# Patient Record
Sex: Female | Born: 1975 | Race: White | Hispanic: No | Marital: Single | Smoking: Former smoker
Health system: Southern US, Community
[De-identification: ages and names within clinical notes are randomized; demographics above are authoritative.]

## PROBLEM LIST (undated history)

## (undated) DIAGNOSIS — E079 Disorder of thyroid, unspecified: Secondary | ICD-10-CM

## (undated) DIAGNOSIS — F419 Anxiety disorder, unspecified: Secondary | ICD-10-CM

## (undated) DIAGNOSIS — T7840XA Allergy, unspecified, initial encounter: Secondary | ICD-10-CM

## (undated) HISTORY — DX: Disorder of thyroid, unspecified: E07.9

## (undated) HISTORY — DX: Anxiety disorder, unspecified: F41.9

## (undated) HISTORY — DX: Allergy, unspecified, initial encounter: T78.40XA

---

## 2006-01-04 HISTORY — PX: SINUSOTOMY: SHX291

## 2009-11-12 ENCOUNTER — Encounter: Admission: RE | Admit: 2009-11-12 | Discharge: 2009-11-12 | Payer: Self-pay | Admitting: Family Medicine

## 2010-01-24 ENCOUNTER — Encounter: Payer: Self-pay | Admitting: Family Medicine

## 2010-02-19 LAB — LIPID PANEL
HDL: 70 mg/dL (ref 35–70)
LDL Cholesterol: 81 mg/dL

## 2010-02-19 LAB — TSH: TSH: 2.73 u[IU]/mL (ref <=5.90)

## 2010-03-27 ENCOUNTER — Encounter: Payer: Self-pay | Admitting: Genetic Counselor

## 2010-08-30 ENCOUNTER — Emergency Department (HOSPITAL_COMMUNITY): Payer: BC Managed Care – PPO

## 2010-08-30 ENCOUNTER — Emergency Department (HOSPITAL_COMMUNITY)
Admission: EM | Admit: 2010-08-30 | Discharge: 2010-08-30 | Disposition: A | Discharge status: Home / Self Care | Payer: BC Managed Care – PPO | Attending: Emergency Medicine | Admitting: Emergency Medicine

## 2010-08-30 DIAGNOSIS — S7000XA Contusion of unspecified hip, initial encounter: Secondary | ICD-10-CM | POA: Insufficient documentation

## 2010-08-30 DIAGNOSIS — E039 Hypothyroidism, unspecified: Secondary | ICD-10-CM | POA: Insufficient documentation

## 2010-08-30 DIAGNOSIS — S0990XA Unspecified injury of head, initial encounter: Secondary | ICD-10-CM | POA: Insufficient documentation

## 2011-01-04 ENCOUNTER — Ambulatory Visit (INDEPENDENT_AMBULATORY_CARE_PROVIDER_SITE_OTHER): Payer: BC Managed Care – PPO

## 2011-01-04 DIAGNOSIS — J019 Acute sinusitis, unspecified: Secondary | ICD-10-CM

## 2011-01-04 DIAGNOSIS — J029 Acute pharyngitis, unspecified: Secondary | ICD-10-CM

## 2011-01-07 ENCOUNTER — Telehealth: Payer: Self-pay | Admitting: *Deleted

## 2011-01-07 NOTE — Telephone Encounter (Signed)
left voice asking the patient to please give me a call about setting up appointment for high risk clinic

## 2011-01-12 ENCOUNTER — Ambulatory Visit (INDEPENDENT_AMBULATORY_CARE_PROVIDER_SITE_OTHER): Payer: BC Managed Care – PPO

## 2011-01-12 DIAGNOSIS — J069 Acute upper respiratory infection, unspecified: Secondary | ICD-10-CM

## 2011-01-12 DIAGNOSIS — R5381 Other malaise: Secondary | ICD-10-CM

## 2011-02-24 ENCOUNTER — Encounter: Payer: Self-pay | Admitting: Family Medicine

## 2011-02-24 ENCOUNTER — Encounter: Payer: Self-pay | Admitting: *Deleted

## 2011-02-24 NOTE — Progress Notes (Signed)
Mailed letter to pt about High Risk clinic. 

## 2011-02-25 ENCOUNTER — Encounter: Payer: Self-pay | Admitting: Family Medicine

## 2011-02-25 ENCOUNTER — Ambulatory Visit (INDEPENDENT_AMBULATORY_CARE_PROVIDER_SITE_OTHER): Payer: BC Managed Care – PPO | Admitting: Family Medicine

## 2011-02-25 VITALS — BP 111/78 | HR 91 | Temp 98.5°F | Resp 16 | Ht 69.0 in | Wt 155.2 lb

## 2011-02-25 DIAGNOSIS — Z0000 Encounter for general adult medical examination without abnormal findings: Secondary | ICD-10-CM

## 2011-02-25 DIAGNOSIS — E039 Hypothyroidism, unspecified: Secondary | ICD-10-CM

## 2011-02-25 DIAGNOSIS — Z23 Encounter for immunization: Secondary | ICD-10-CM

## 2011-02-25 MED ORDER — LEVOCETIRIZINE DIHYDROCHLORIDE 5 MG PO TABS: 5.0000 mg | ORAL_TABLET | Freq: Every day | ORAL | Status: DC

## 2011-02-25 MED ORDER — FLUTICASONE FUROATE 27.5 MCG/SPRAY NA SUSP: 2.0000 | Freq: Every day | NASAL | Status: DC

## 2011-02-25 MED ORDER — RIZATRIPTAN BENZOATE 10 MG PO TABS: 10.0000 mg | ORAL_TABLET | ORAL | Status: DC | PRN

## 2011-02-25 NOTE — Progress Notes (Signed)
Patient ID: Molly Lozano MRN: [id number], DOB: 1975/02/14, 36 y.o. Date of Encounter: 02/25/2011, 2:44 PM  Primary Physician: No primary provider on file.  Chief Complaint: Physical (CPE)  HPI: 36 y.o. y/o female with history of noted below here for CPE.  Doing well. Low back pain last fall with disc issues   Review of Systems: Consitutional: No fever, chills, fatigue, night sweats, lymphadenopathy, or weight changes. Eyes: No visual changes, eye redness, or discharge. ENT/Mouth: Ears: No otalgia, tinnitus, hearing loss, discharge. Nose: No congestion, rhinorrhea, sinus pain, or epistaxis. Throat: No sore throat, post nasal drip, or teeth pain. Cardiovascular: No CP, palpitations, diaphoresis, DOE, edema, orthopnea, PND. Respiratory: No cough, hemoptysis, SOB, or wheezing. Gastrointestinal: No anorexia, dysphagia, reflux, pain, nausea, vomiting, hematemesis, diarrhea, constipation, BRBPR, or melena.  Genitourinary: No dysuria, frequency, urgency, hematuria, incontinence, nocturia, amenorrhea, vaginal discharge, pruritis, burning, abnormal bleeding, or pain. Musculoskeletal: No decreased ROM, myalgias, stiffness, joint swelling, or weakness. Skin: No rash, erythema, lesion changes, pain, warmth, jaundice, or pruritis. Neurological: No headache, dizziness, syncope, seizures, tremors, memory loss, coordination problems, or paresthesias. Psychological: No anxiety, depression, hallucinations, SI/HI. Endocrine: No fatigue, polydipsia, polyphagia, polyuria, or known diabetes. All other systems were reviewed and are otherwise negative.   Home Meds:  Prior to Admission medications   Medication Sig Start Date End Date Taking? Authorizing Provider  estradiol (ESTRACE) 0.1 MG/GM vaginal cream Place 1 g vaginally daily.   Yes Historical Provider, MD  fluticasone (VERAMYST) 27.5 MCG/SPRAY nasal spray Place 2 sprays into the nose daily. 02/25/11  Yes Elvina Sidle, MD  levocetirizine (XYZAL) 5  MG tablet Take 1 tablet (5 mg total) by mouth daily. 02/25/11  Yes Elvina Sidle, MD  levonorgestrel-ethinyl estradiol (LYBREL,AMETHYST) 90-20 MCG tablet Take 1 tablet by mouth daily.   Yes Historical Provider, MD  levothyroxine (SYNTHROID, LEVOTHROID) 25 MCG tablet Take 25 mcg by mouth daily.   Yes Historical Provider, MD  Multiple Vitamin (MULTIVITAMIN) tablet Take 1 tablet by mouth daily.   Yes Historical Provider, MD  rizatriptan (MAXALT) 10 MG tablet Take 1 tablet (10 mg total) by mouth as needed. May repeat in 2 hours if needed 02/25/11  Yes Elvina Sidle, MD    Allergies:  Allergies  Allergen Reactions  . Amoxicillin Nausea And Vomiting  . Mucinex Other (See Comments)    "off balance"    History   Social History  . Marital Status: Single    Spouse Name: N/A    Number of Children: N/A  . Years of Education: N/A   Occupational History  . Not on file.   Social History Main Topics  . Smoking status: Former Smoker    Types: Cigarettes    Quit date: 01/04/2002  . Smokeless tobacco: Not on file  . Alcohol Use:   . Drug Use:   . Sexually Active:    Other Topics Concern  . Not on file   Social History Narrative  . No narrative on file    No family history on file.  Physical Exam: Blood pressure 111/78, pulse 91, temperature 98.5 F (36.9 C), temperature source Oral, resp. rate 16, height 5\' 9"  (1.753 m), weight 155 lb 3.2 oz (70.398 kg)., Body mass index is 22.92 kg/(m^2). General: Well developed, well nourished, in no acute distress. HEENT: Normocephalic, atraumatic. Conjunctiva pink, sclera non-icteric. Pupils 2 mm constricting to 1 mm, round, regular, and equally reactive to light and accomodation. EOMI. Internal auditory canal clear. TMs with good cone of light and without pathology. Nasal  mucosa pink. Nares are without discharge. No sinus tenderness. Oral mucosa pink. Dentition. Pharynx without exudate.   Neck: Supple. Trachea midline. No thyromegaly. Full ROM.  No lymphadenopathy. Lungs: Clear to auscultation bilaterally without wheezes, rales, or rhonchi. Breathing is of normal effort and unlabored. Cardiovascular: RRR with S1 S2. No murmurs, rubs, or gallops appreciated. Distal pulses 2+ symmetrically. No carotid or abdominal bruits.  Abdomen: Soft, non-tender, non-distended with normoactive bowel sounds. No hepatosplenomegaly or masses. No rebound/guarding. No CVA tenderness. Without hernias.   Musculoskeletal: Full range of motion and 5/5 strength throughout. Without swelling, atrophy, tenderness, crepitus, or warmth. Extremities without clubbing, cyanosis, or edema. Calves supple. Skin: Warm and moist without erythema, ecchymosis, wounds, or rash. Neuro: A+Ox3. CN II-XII grossly intact. Moves all extremities spontaneously. Full sensation throughout. Normal gait. DTR 2+ throughout upper and lower extremities. Finger to nose intact. Psych:  Responds to questions appropriately with a normal affect.     Assessment/Plan:  Low back pain, mild, slow improvement since last fall.  Recommend swimming, yoga. Also suggest relaxation from work  36 y.o. y/o female here for CPE   Signed, Oryan Winterton 02/25/2011 2:44 PM

## 2011-02-26 LAB — COMPREHENSIVE METABOLIC PANEL
ALT: 13 U/L (ref 0–35)
AST: 19 U/L (ref 0–37)
Albumin: 4.7 g/dL (ref 3.5–5.2)
Alkaline Phosphatase: 43 U/L (ref 39–117)
BUN: 14 mg/dL (ref 6–23)
CO2: 23 mEq/L (ref 19–32)
Calcium: 9.9 mg/dL (ref 8.4–10.5)
Chloride: 106 mEq/L (ref 96–112)
Creat: 0.77 mg/dL (ref 0.50–1.10)
Glucose, Bld: 86 mg/dL (ref 70–99)
Potassium: 4.7 mEq/L (ref 3.5–5.3)
Sodium: 138 mEq/L (ref 135–145)
Total Bilirubin: 0.9 mg/dL (ref 0.3–1.2)
Total Protein: 7.1 g/dL (ref 6.0–8.3)

## 2011-02-26 LAB — TSH: TSH: 2.516 u[IU]/mL (ref 0.350–4.500)

## 2011-04-30 ENCOUNTER — Other Ambulatory Visit: Payer: Self-pay | Admitting: Family Medicine

## 2011-06-03 ENCOUNTER — Encounter: Payer: Self-pay | Admitting: Family Medicine

## 2011-06-03 ENCOUNTER — Ambulatory Visit (INDEPENDENT_AMBULATORY_CARE_PROVIDER_SITE_OTHER): Payer: BC Managed Care – PPO | Admitting: Family Medicine

## 2011-06-03 VITALS — BP 110/78 | HR 89 | Temp 98.9°F | Resp 16 | Ht 69.0 in | Wt 150.0 lb

## 2011-06-03 DIAGNOSIS — R059 Cough, unspecified: Secondary | ICD-10-CM

## 2011-06-03 DIAGNOSIS — J029 Acute pharyngitis, unspecified: Secondary | ICD-10-CM

## 2011-06-03 DIAGNOSIS — J40 Bronchitis, not specified as acute or chronic: Secondary | ICD-10-CM

## 2011-06-03 DIAGNOSIS — R05 Cough: Secondary | ICD-10-CM

## 2011-06-03 LAB — POCT CBC
Granulocyte percent: 83.7 %G — AB (ref 37–80)
MCH, POC: 28.9 pg (ref 27–31.2)
MID (cbc): 0.6 (ref 0–0.9)
MPV: 10.6 fL (ref 0–99.8)
POC MID %: 4.1 %M (ref 0–12)
Platelet Count, POC: 314 10*3/uL (ref 142–424)
RBC: 5.26 M/uL (ref 4.04–5.48)
WBC: 14.9 10*3/uL — AB (ref 4.6–10.2)

## 2011-06-03 MED ORDER — BENZONATATE 100 MG PO CAPS: ORAL_CAPSULE | ORAL | Status: DC

## 2011-06-03 MED ORDER — AZITHROMYCIN 250 MG PO TABS: ORAL_TABLET | ORAL | Status: DC

## 2011-06-03 NOTE — Patient Instructions (Signed)
Use Mucinex DM as necessary for cough Drink lots of fluids Tylenol or ibuprofen for sore throat and fever Tessalon as necessary for additional cough medication Zithromax for infection Return if worse

## 2011-06-03 NOTE — Progress Notes (Signed)
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 She came back Monday. On Tuesday she developed a sore throat and respiratory congestion. 2 teething she had a temperature of 102. She felt sick and yesterday stayed off of work. She did not have a fever we checked it yesterday evening. She has continued with a sore throat. Not blowing much out of her nose but she coughs and brings up a lot of yellow mucus. She does not get many infections like this, says this is worse she's been about 3 years.  Objective: Alert white female mildly ill-appearing. TMs are normal. Throat erythematous without exudate. She does have some mucus in her throat. Strep screen is taken. Neck supple without nodes. Chest is clear to auscultation. Heart regular.  Assessment: Sore throat Cough  Plan: CBC and strep screen and proceed from there Results for orders placed in visit on 06/03/11  POCT RAPID STREP A (OFFICE)      Component Value Range   Rapid Strep A Screen Negative  Negative   POCT CBC      Component Value Range   WBC 14.9 (*) 4.6 - 10.2 (K/uL)   Lymph, poc 1.8  0.6 - 3.4    POC LYMPH PERCENT 12.2  10 - 50 (%L)   MID (cbc) 0.6  0 - 0.9    POC MID % 4.1  0 - 12 (%M)   POC Granulocyte 12.5 (*) 2 - 6.9    Granulocyte percent 83.7 (*) 37 - 80 (%G)   RBC 5.26  4.04 - 5.48 (M/uL)   Hemoglobin 15.2  12.2 - 16.2 (g/dL)   HCT, POC 82.9  56.2 - 47.9 (%)   MCV 88.4  80 - 97 (fL)   MCH, POC 28.9  27 - 31.2 (pg)   MCHC 32.7  31.8 - 35.4 (g/dL)   RDW, POC 13.0     Platelet Count, POC 314  142 - 424 (K/uL)   MPV 10.6  0 - 99.8 (fL)   Strep test is negative, but with the high white blood count I am going to go ahead and treat her with antibiotics for bronchitis. [place] Assessment: Bronchitis Non- strep pharyngitis

## 2011-06-11 ENCOUNTER — Ambulatory Visit (INDEPENDENT_AMBULATORY_CARE_PROVIDER_SITE_OTHER): Payer: BC Managed Care – PPO | Admitting: Family Medicine

## 2011-06-11 VITALS — BP 97/65 | HR 75 | Temp 98.5°F | Resp 16 | Ht 69.18 in | Wt 149.0 lb

## 2011-06-11 DIAGNOSIS — R05 Cough: Secondary | ICD-10-CM

## 2011-06-11 DIAGNOSIS — H6500 Acute serous otitis media, unspecified ear: Secondary | ICD-10-CM

## 2011-06-11 DIAGNOSIS — H6590 Unspecified nonsuppurative otitis media, unspecified ear: Secondary | ICD-10-CM

## 2011-06-11 DIAGNOSIS — J019 Acute sinusitis, unspecified: Secondary | ICD-10-CM

## 2011-06-11 DIAGNOSIS — J329 Chronic sinusitis, unspecified: Secondary | ICD-10-CM

## 2011-06-11 DIAGNOSIS — R059 Cough, unspecified: Secondary | ICD-10-CM

## 2011-06-11 MED ORDER — HYDROCODONE-ACETAMINOPHEN 5-500 MG PO TABS: ORAL_TABLET | ORAL | Status: DC

## 2011-06-11 MED ORDER — PREDNISONE 20 MG PO TABS: ORAL_TABLET | ORAL | Status: DC

## 2011-06-11 MED ORDER — LEVOFLOXACIN 500 MG PO TABS: 500.0000 mg | ORAL_TABLET | Freq: Every day | ORAL | Status: AC

## 2011-06-11 NOTE — Progress Notes (Signed)
S: Ear plugged sensation.  Fullness. Cough at night persists Some hot flashes, no defined fever  O: TM L ear fluid level Throat clear Sinuses tender Cheest clear Heart rrr  A:  Serous otitis Sinusitis Cough  See rx

## 2011-06-11 NOTE — Patient Instructions (Signed)
Use hydrocodone pills for cough.  Levaquin for ear infection.  Prednisone to try to open up the eustachian tube.  Return if not improving.

## 2011-12-16 ENCOUNTER — Telehealth: Payer: Self-pay

## 2011-12-16 MED ORDER — LEVOTHYROXINE SODIUM 25 MCG PO TABS: 25.0000 ug | ORAL_TABLET | Freq: Every day | ORAL | Status: DC

## 2011-12-16 NOTE — Telephone Encounter (Signed)
The patient stated that she took her last Levothyroxine 25 mg pill two days ago and her mail order pharmacy won't deliver her medicine for another 6 days.  The mail order pharmacy advised the patient to call [facility] and ask for a 7 day only rx to take to a local pharmacy.  The patient stated that she would prefer the medication [facility] [address].  Please call the patient at [phone].

## 2011-12-16 NOTE — Telephone Encounter (Signed)
Notified pt on VM that [facility]

## 2011-12-16 NOTE — Telephone Encounter (Signed)
[facility] to [facility] [address]

## 2011-12-28 ENCOUNTER — Other Ambulatory Visit: Payer: Self-pay

## 2011-12-28 MED ORDER — LEVOTHYROXINE SODIUM 25 MCG PO TABS: 25.0000 ug | ORAL_TABLET | Freq: Every day | ORAL | Status: DC

## 2011-12-28 NOTE — Telephone Encounter (Signed)
Meds ordered this encounter  Medications  . levothyroxine (SYNTHROID, LEVOTHROID) 25 MCG tablet    Sig: Take 1 tablet (25 mcg total) by mouth daily.    Dispense:  7 tablet    Refill:  0    Order Specific Question:  Supervising Provider    Answer:  DOOLITTLE, ROBERT P [[id number]]  . levothyroxine (SYNTHROID, LEVOTHROID) 25 MCG tablet    Sig: Take 1 tablet (25 mcg total) by mouth daily. Need office visit & labs for additional refills.    Dispense:  90 tablet    Refill:  0    Order Specific Question:  Supervising Provider    Answer:  DOOLITTLE, ROBERT P [[id number]]

## 2011-12-28 NOTE — Telephone Encounter (Signed)
Patient states that she needs to get 7 days supply of synthroid called in to [facility]

## 2011-12-28 NOTE — Telephone Encounter (Signed)
[facility] is calling wanting to know if we could call in or fax in a perscription for this patient's Synthroid 90 day supply with 3 refills.  They mailed out the packet and it was returned to them because of the wrong address.  They would like to have the medicine out to the patient by Friday.   Ph#: [phone]  Fx#:  [phone]

## 2012-03-06 ENCOUNTER — Ambulatory Visit (INDEPENDENT_AMBULATORY_CARE_PROVIDER_SITE_OTHER): Payer: BC Managed Care – PPO | Admitting: Family Medicine

## 2012-03-06 ENCOUNTER — Other Ambulatory Visit: Payer: Self-pay

## 2012-03-06 VITALS — BP 112/76 | HR 84 | Temp 97.7°F | Resp 16 | Ht 69.5 in | Wt 151.0 lb

## 2012-03-06 DIAGNOSIS — M5116 Intervertebral disc disorders with radiculopathy, lumbar region: Secondary | ICD-10-CM

## 2012-03-06 DIAGNOSIS — M549 Dorsalgia, unspecified: Secondary | ICD-10-CM

## 2012-03-06 DIAGNOSIS — J029 Acute pharyngitis, unspecified: Secondary | ICD-10-CM

## 2012-03-06 DIAGNOSIS — M5126 Other intervertebral disc displacement, lumbar region: Secondary | ICD-10-CM

## 2012-03-06 MED ORDER — PREDNISONE 20 MG PO TABS: ORAL_TABLET | ORAL | Status: DC

## 2012-03-06 MED ORDER — HYDROCODONE-ACETAMINOPHEN 5-325 MG PO TABS: ORAL_TABLET | ORAL | Status: DC

## 2012-03-06 NOTE — Patient Instructions (Signed)
Viral Pharyngitis  Viral pharyngitis is a viral infection that produces redness, pain, and swelling (inflammation) of the throat. It can spread from person to person (contagious).  CAUSES  Viral pharyngitis is caused by inhaling a large amount of certain germs called viruses. Many different viruses cause viral pharyngitis.  SYMPTOMS  Symptoms of viral pharyngitis include:   Sore throat.   Tiredness.   Stuffy nose.   Low-grade fever.   Congestion.   Cough.  TREATMENT  Treatment includes rest, drinking plenty of fluids, and the use of over-the-counter medication (approved by your caregiver).  HOME CARE INSTRUCTIONS    Drink enough fluids to keep your urine clear or pale yellow.   Eat soft, cold foods such as ice cream, frozen ice pops, or gelatin dessert.   Gargle with warm salt water (1 tsp salt per 1 qt of water).   If over age 7, throat lozenges may be used safely.   Only take over-the-counter or prescription medicines for pain, discomfort, or fever as directed by your caregiver. Do not take aspirin.  To help prevent spreading viral pharyngitis to others, avoid:   Mouth-to-mouth contact with others.   Sharing utensils for eating and drinking.   Coughing around others.  SEEK MEDICAL CARE IF:    You are better in a few days, then become worse.   You have a fever or pain not helped by pain medicines.   There are any other changes that concern you.  Document Released: 09/30/2004 Document Revised: 03/15/2011 Document Reviewed: 02/26/2010  Exit[employer] Patient Information 2013 [employer].

## 2012-03-06 NOTE — Addendum Note (Signed)
Addended by: Eddie Candle on: 03/06/2012 02:36 PM   Modules accepted: Orders, Medications

## 2012-03-06 NOTE — Progress Notes (Signed)
Subjective: 37 year old lady with history of having a sore throat since over the weekend. She has not had a lot of nasal drainage, but is aware of postnasal drainage. She does not smoke. She has not been coughing a lot yet. The throat is been fairly painful.  She also has been having right lumbar radiculopathy for about a week. She has a history of a disc problem 2 years ago. Has been going to a chiropractor for adjustments regularly, and has been to her back surgeon, Dr.Tooke , who had to treat her with prednisone in January for what the MRI revealed to be a new area of herniation of disc as compared to 2 years ago. She did better until last week when she tripped a tiny bit, flaring it up again.  Objective: Pleasant alert oriented young lady in no major distress. Her gait is fair, though seems to walk carefully from the back. Her TMs are normal. Throat mild erythema without any exit 8. Strep screen was taken. Neck is supple without significant nodes. Chest clear to auscultation. Heart regular.  Her straight leg raising test causes some contralateral pain when raising the left leg up to above 80. The right leg also raises to above 80. Deep tendon reflexes knee jerks are symmetrical. She is not able to flex forward in a standing position, though she can extend a fair amount. Side to side tilt and upper body rotation is good.   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

## 2012-03-15 ENCOUNTER — Ambulatory Visit (INDEPENDENT_AMBULATORY_CARE_PROVIDER_SITE_OTHER): Payer: BC Managed Care – PPO | Admitting: Family Medicine

## 2012-03-15 ENCOUNTER — Encounter: Payer: Self-pay | Admitting: Family Medicine

## 2012-03-15 VITALS — BP 106/68 | HR 85 | Temp 99.0°F | Resp 12 | Ht 69.0 in | Wt 151.2 lb

## 2012-03-15 DIAGNOSIS — J309 Allergic rhinitis, unspecified: Secondary | ICD-10-CM | POA: Insufficient documentation

## 2012-03-15 DIAGNOSIS — E039 Hypothyroidism, unspecified: Secondary | ICD-10-CM

## 2012-03-15 DIAGNOSIS — J329 Chronic sinusitis, unspecified: Secondary | ICD-10-CM

## 2012-03-15 DIAGNOSIS — M549 Dorsalgia, unspecified: Secondary | ICD-10-CM

## 2012-03-15 MED ORDER — PREDNISONE 10 MG PO TABS: 10.0000 mg | ORAL_TABLET | ORAL | Status: DC

## 2012-03-15 MED ORDER — CYCLOBENZAPRINE HCL 5 MG PO TABS: 5.0000 mg | ORAL_TABLET | Freq: Every evening | ORAL | Status: DC | PRN

## 2012-03-15 MED ORDER — LEVOTHYROXINE SODIUM 25 MCG PO TABS: 25.0000 ug | ORAL_TABLET | Freq: Every day | ORAL | Status: DC

## 2012-03-15 MED ORDER — LEVOCETIRIZINE DIHYDROCHLORIDE 5 MG PO TABS: 5.0000 mg | ORAL_TABLET | Freq: Every day | ORAL | Status: DC

## 2012-03-15 MED ORDER — FLUTICASONE FUROATE 27.5 MCG/SPRAY NA SUSP: 2.0000 | Freq: Every day | NASAL | Status: DC

## 2012-03-15 MED ORDER — LEVOFLOXACIN 500 MG PO TABS: 500.0000 mg | ORAL_TABLET | Freq: Every day | ORAL | Status: DC

## 2012-03-15 NOTE — Progress Notes (Signed)
Is a 37 year old woman with chronic intermittent back pain which became worse after her recent chiropractor visit where he was manipulation resulted in increased low back pain. She's done better with the prednisone urgently prescribed and is now back at work. Nevertheless he continues to have soreness in her low back and the MRI did show swelling of the disc in that area. She's tried using cold compresses which have only given her minimal relief. She is no leg pain radiation. Has no bowel or bladder incontinence. Her sleep has been interrupted because of pain and would like some thing done for this.  Patient also needs her yearly refills on her allergy medicine which is done well controlling her allergy symptoms. She's had a recent URI and has persistent sinus congestion over the past 2 weeks. Her sore throat is resolved and she does not have a cough. She also does not have a fever. She has minimal facial pain but does have some pressure in her cheeks.  Patient also needs to have her thyroid checked. He was last checked one year ago and she's been largely asymptomatic on her current Synthroid dose.  Objective: No acute distress TMs normal Oropharynx: No erythema and, slight swelling of the tonsils Nose: Mucopurulent discharge with swelling Neck: Supple no adenopathy Chest: Clear Skin: Normal Straight leg raising: Negative while sitting  Assessment: URI with subsequent sinus infection, persistent low back pain which is improving, chronic allergic rhinitis, mild hypothyroidism  Plan: Allergic rhinitis - Plan: fluticasone (VERAMYST) 27.5 MCG/SPRAY nasal spray, levocetirizine (XYZAL) 5 MG tablet  Unspecified hypothyroidism - Plan: levothyroxine (SYNTHROID, LEVOTHROID) 25 MCG tablet, TSH  Unspecified sinusitis (chronic) - Plan: levofloxacin (LEVAQUIN) 500 MG tablet  Back pain - Plan: predniSONE (DELTASONE) 10 MG tablet, cyclobenzaprine (FLEXERIL) 5 MG tablet  Signed, Sheila Oats.D.

## 2012-03-16 LAB — TSH: TSH: 1.234 u[IU]/mL (ref 0.350–4.500)

## 2012-03-17 ENCOUNTER — Telehealth: Payer: Self-pay | Admitting: Radiology

## 2012-03-17 NOTE — Telephone Encounter (Signed)
Refill encounter only 

## 2012-03-21 ENCOUNTER — Telehealth: Payer: Self-pay | Admitting: Physician Assistant

## 2012-03-21 DIAGNOSIS — G43909 Migraine, unspecified, not intractable, without status migrainosus: Secondary | ICD-10-CM

## 2012-03-21 MED ORDER — RIZATRIPTAN BENZOATE 10 MG PO TABS: 10.0000 mg | ORAL_TABLET | ORAL | Status: DC | PRN

## 2012-03-21 NOTE — Telephone Encounter (Signed)
Received faxed rx request for Maxalt.  Sent to pharmacy

## 2012-03-23 ENCOUNTER — Telehealth: Payer: Self-pay

## 2012-03-23 NOTE — Telephone Encounter (Signed)
[facility] called and stated that Veramyst NS Dr L Rxd is not on their preferred list and asked if Dr L could change Rx to one of the preferred NS which are: fluticasone, triamcinolone NS, Nasonex, Q Nasal. We should put reference # on new Rx which is #[id number]. Dr L do you want to send in a different Rx?

## 2012-03-24 NOTE — Telephone Encounter (Signed)
[facility] called back to check status of request for a change in Rx to a preferred NS listed in message below. Dr L, please advise.

## 2012-03-25 MED ORDER — MOMETASONE FUROATE 50 MCG/ACT NA SUSP: 2.0000 | Freq: Every day | NASAL | Status: DC

## 2012-03-25 NOTE — Telephone Encounter (Signed)
Rx sent in

## 2012-03-25 NOTE — Telephone Encounter (Signed)
Please call in Nasonex QD with 1 year refill, thanks

## 2012-05-30 ENCOUNTER — Ambulatory Visit (INDEPENDENT_AMBULATORY_CARE_PROVIDER_SITE_OTHER): Payer: BC Managed Care – PPO | Admitting: Family Medicine

## 2012-05-30 VITALS — BP 110/84 | HR 76 | Temp 98.0°F | Resp 16 | Ht 70.0 in | Wt 162.0 lb

## 2012-05-30 DIAGNOSIS — M545 Low back pain, unspecified: Secondary | ICD-10-CM

## 2012-05-30 DIAGNOSIS — M5416 Radiculopathy, lumbar region: Secondary | ICD-10-CM

## 2012-05-30 DIAGNOSIS — IMO0002 Reserved for concepts with insufficient information to code with codable children: Secondary | ICD-10-CM

## 2012-05-30 MED ORDER — METHOCARBAMOL 750 MG PO TABS: ORAL_TABLET | ORAL | Status: DC

## 2012-05-30 MED ORDER — PREDNISONE 20 MG PO TABS: ORAL_TABLET | ORAL | Status: DC

## 2012-05-30 NOTE — Progress Notes (Signed)
Subjective: 37 year old lady who has been having a long history of low back pain problems. She is seeing numerous physicians and had various studies done. She has been found to have a little bulge of a couple of discs and possible nerve impingement. She has received 2 cortisone injections to her back, the last about 6 weeks ago. She has tried various modalities, physical therapy, cupping, etc. without good long relief. She says that her weight is at a high level for her right now. 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 The pain centers in the sacral area primarily, feels like bubbles sitting back there. Pain radiates down to her legs some, more to the right. Bowels and been acting fair, though it hurts to push so she doesn't go as well currently. No new injuries. She does not play any sports.  Objective: Healthy-appearing young lady in no major distress distress moving around she moves slowly. She says her biggest concern is that lysis of she can hardly walk now. Her abdomen is soft. Paraspinous muscles are adequate. She can only flex forward about 20. Extension also causes pain. Side to side tilt is adequate as is trunk rotation. Deep tendon reflexes symmetrical. Straight leg raise test positive at about 60 on the right, negative on the left. Bruised from cupping low back Assessment: Low back pain with radiculopathy  Plan: Have her latest MRI, but previous 1 showed central bulge at came close to the nerve roots. However no frank disc herniation.  PT  Muscle relaxant and steroids

## 2012-05-30 NOTE — Patient Instructions (Addendum)
Take Aleve for the pain  Tried to do back exercises regularly. Make sure you stretch her back regularly at work.  Take the Robaxin before meals and at bedtime for muscle relaxants as needed  Take a prednisone course tapering it as directed per instructions.  Read the care of your back booklet. We will make physical therapy referral for you.

## 2013-02-24 ENCOUNTER — Other Ambulatory Visit: Payer: Self-pay | Admitting: Family Medicine

## 2013-04-20 ENCOUNTER — Other Ambulatory Visit: Payer: Self-pay | Admitting: Family Medicine

## 2013-05-03 ENCOUNTER — Other Ambulatory Visit: Payer: Self-pay | Admitting: Physician Assistant

## 2013-05-17 ENCOUNTER — Ambulatory Visit (INDEPENDENT_AMBULATORY_CARE_PROVIDER_SITE_OTHER): Payer: BC Managed Care – PPO | Admitting: Family Medicine

## 2013-05-17 ENCOUNTER — Encounter: Payer: Self-pay | Admitting: Family Medicine

## 2013-05-17 VITALS — BP 120/80 | HR 70 | Resp 16 | Ht 68.5 in | Wt 169.0 lb

## 2013-05-17 DIAGNOSIS — L309 Dermatitis, unspecified: Secondary | ICD-10-CM

## 2013-05-17 DIAGNOSIS — J309 Allergic rhinitis, unspecified: Secondary | ICD-10-CM

## 2013-05-17 DIAGNOSIS — M65839 Other synovitis and tenosynovitis, unspecified forearm: Secondary | ICD-10-CM

## 2013-05-17 DIAGNOSIS — L259 Unspecified contact dermatitis, unspecified cause: Secondary | ICD-10-CM

## 2013-05-17 DIAGNOSIS — M778 Other enthesopathies, not elsewhere classified: Secondary | ICD-10-CM

## 2013-05-17 DIAGNOSIS — M65849 Other synovitis and tenosynovitis, unspecified hand: Secondary | ICD-10-CM

## 2013-05-17 DIAGNOSIS — E039 Hypothyroidism, unspecified: Secondary | ICD-10-CM

## 2013-05-17 MED ORDER — FLUTICASONE FUROATE 27.5 MCG/SPRAY NA SUSP: 2.0000 | Freq: Every day | NASAL | Status: DC

## 2013-05-17 MED ORDER — FLUTICASONE PROPIONATE 0.05 % EX CREA: TOPICAL_CREAM | CUTANEOUS | Status: DC

## 2013-05-17 MED ORDER — DICLOFENAC SODIUM 75 MG PO TBEC: 75.0000 mg | DELAYED_RELEASE_TABLET | Freq: Two times a day (BID) | ORAL | Status: DC | PRN

## 2013-05-17 MED ORDER — FLUTICASONE FUROATE 27.5 MCG/SPRAY NA SUSP: 2.0000 | Freq: Every day | NASAL | Status: AC

## 2013-05-17 MED ORDER — LEVOTHYROXINE SODIUM 25 MCG PO TABS: ORAL_TABLET | ORAL | Status: DC

## 2013-05-17 MED ORDER — LEVOCETIRIZINE DIHYDROCHLORIDE 5 MG PO TABS: 5.0000 mg | ORAL_TABLET | Freq: Every evening | ORAL | Status: DC

## 2013-05-17 NOTE — Progress Notes (Addendum)
Subjective:  This chart was scribed for Molly SidleKurt Lauenstein, MD by Molly Lozano, ED Scribe. This patient was seen in room 25 and the patient's care was started at 12:26 PM.   Patient ID: Molly Lozano, female    DOB: 1975-01-11, 38 y.o.   MRN: [id number]  Chief Complaint  Patient presents with   medication refills    pended refills   Hypothyroidism    pended refill of Synthroid also, due for labs    HPI HPI Comments: Molly Lozano is a 38 y.o. female who works for [employer], presents to the [facility] requesting medication refills her hypothyroidism.   Pt is complaining of mild nasal allergies, but she says she could not get the medication refilled. She states that she's been sneezing more. Not much coughing. They told her to come in to the office for a visit before being approved for her medication.   She also states that her eczema is starting to flare up. Advised her that her flare up could be due to her not taking her medication.   Pt is also complaining of right wrist pain. She states that the pain is worsened by typing for long periods of time. Pt reports a history of tendinitis.   Other than the symptoms listed above, pt states that she is generally healthy.   Patient Active Problem List   Diagnosis Date Noted   Allergic rhinitis 03/15/2012   Past Medical History  Diagnosis Date   Allergy    Anxiety    Thyroid disease    No past surgical history on file. Allergies  Allergen Reactions   Amoxicillin Nausea And Vomiting   Guaifenesin Er Other (See Comments)    "off balance"   Prior to Admission medications   Medication Sig Start Date End Date Taking? Authorizing Provider  fluticasone (CUTIVATE) 0.05 % cream APPLY TWICE A DAY AS NEEDED FOR PATCHES OF ECZEMA 04/30/11  Yes Marzella SchleinAngela M McClung, PA-C  fluticasone (VERAMYST) 27.5 MCG/SPRAY nasal spray Place 2 sprays into the nose daily. 03/15/12  Yes Molly SidleKurt Lauenstein, MD  levocetirizine  (XYZAL) 5 MG tablet Take 1 tablet (5 mg total) by mouth daily. PATIENT NEEDS OFFICE VISIT FOR ADDITIONAL REFILLS   Yes Morrell RiddleSarah L Weber, PA-C  levonorgestrel-ethinyl estradiol (LYBREL,AMETHYST) 90-20 MCG tablet Take 1 tablet by mouth daily.   Yes Historical Provider, MD  levothyroxine (SYNTHROID, LEVOTHROID) 25 MCG tablet Take one table daily 04/20/13  Yes Ryan M Dunn, PA-C  Multiple Vitamin (MULTIVITAMIN) tablet Take 1 tablet by mouth daily.   Yes Historical Provider, MD  rizatriptan (MAXALT) 10 MG tablet Take 1 tablet (10 mg total) by mouth as needed. May repeat in 2 hours if needed 03/21/12  Yes Godfrey PickEleanore E Egan, PA-C   History   Social History   Marital Status: Single    Spouse Name: N/A    Number of Children: N/A   Years of Education: N/A   Occupational History   Not on file.   Social History Main Topics   Smoking status: Former Smoker    Types: Cigarettes    Quit date: 01/04/2002   Smokeless tobacco: Not on file   Alcohol Use: Yes   Drug Use: No   Sexual Activity: Not Currently   Other Topics Concern   Not on file   Social History Narrative   No narrative on file   Review of Systems A complete 10 system review of systems was obtained and all systems are negative except as  noted in the HPI and PMH.      Objective:   Physical Exam  Nursing note and vitals reviewed. Constitutional: She is oriented to person, place, and time. She appears well-developed and well-nourished. No distress.  HENT:  Head: Normocephalic and atraumatic.  Right Ear: External ear normal.  Left Ear: External ear normal.  Nose: Nose normal.  Mouth/Throat: Oropharynx is clear and moist. No oropharyngeal exudate.  Eyes: Conjunctivae and EOM are normal. Pupils are equal, round, and reactive to light. Right eye exhibits no discharge. Left eye exhibits no discharge.  Neck: Normal range of motion. Neck supple. No JVD present. No thyromegaly present.  Cardiovascular: Normal rate, regular rhythm and  normal heart sounds.  Exam reveals no gallop and no friction rub.   No murmur heard. Pulmonary/Chest: Effort normal and breath sounds normal. No stridor. No respiratory distress. She has no wheezes. She has no rales. She exhibits no tenderness.  Abdominal: Soft. Bowel sounds are normal. She exhibits no distension. There is no tenderness.  Musculoskeletal: She exhibits no edema and no tenderness.  Lymphadenopathy:    She has no cervical adenopathy.  Neurological: She is alert and oriented to person, place, and time. No cranial nerve deficit. Coordination normal.  Skin: Skin is warm and dry. No rash noted. No erythema. No pallor.  Psychiatric: She has a normal mood and affect. Her behavior is normal. Thought content normal.   Wt Readings from Last 3 Encounters:  05/17/13 169 lb (76.658 kg)  05/30/12 162 lb (73.483 kg)  03/15/12 151 lb 3.2 oz (68.584 kg)   BP Readings from Last 3 Encounters:  05/17/13 120/80  05/30/12 110/84  03/15/12 106/68   Filed Vitals:   05/17/13 1218  BP: 120/80  Pulse: 70  Resp: 16  Height: 5' 8.5" (1.74 m)  Weight: 169 lb (76.658 kg)  SpO2: 98%   Negative tinels No thyromegaly Assessment & Plan:    Allergic rhinitis - Plan: levocetirizine (XYZAL) 5 MG tablet, fluticasone (VERAMYST) 27.5 MCG/SPRAY nasal spray, DISCONTINUED: fluticasone (VERAMYST) 27.5 MCG/SPRAY nasal spray  Hypothyroid - Plan: levothyroxine (SYNTHROID, LEVOTHROID) 25 MCG tablet, TSH  Eczema - Plan: fluticasone (CUTIVATE) 0.05 % cream, DISCONTINUED: fluticasone (CUTIVATE) 0.05 % cream  Signed, Molly SidleKurt Lauenstein, MD   Diclofenac 75 qpm with food for wrist tendonitis.  I personally performed the services described in this documentation, which was scribed in my presence. The recorded information has been reviewed and is accurate.

## 2013-05-18 LAB — TSH: TSH: 2.065 u[IU]/mL (ref 0.350–4.500)

## 2013-11-02 ENCOUNTER — Ambulatory Visit (INDEPENDENT_AMBULATORY_CARE_PROVIDER_SITE_OTHER): Payer: BC Managed Care – PPO | Admitting: Physician Assistant

## 2013-11-02 VITALS — BP 105/63 | HR 79 | Temp 98.5°F | Resp 18 | Wt 177.0 lb

## 2013-11-02 DIAGNOSIS — J329 Chronic sinusitis, unspecified: Secondary | ICD-10-CM

## 2013-11-02 DIAGNOSIS — J019 Acute sinusitis, unspecified: Secondary | ICD-10-CM

## 2013-11-02 MED ORDER — LEVOFLOXACIN 500 MG PO TABS: 500.0000 mg | ORAL_TABLET | Freq: Every day | ORAL | Status: DC

## 2013-11-02 NOTE — Progress Notes (Signed)
   Subjective:    Patient ID: Molly Lozano, female    DOB: 1975-03-19, 38 y.o.   MRN: [id number]  HPI  This is a 38 year old female with PMH recurrent sinusitis and environmental allergies presenting with 2 weeks of congestion, fatigue, sinus pressure and sore throat. She has noticed increasing nasal mucous in the past week. She has developed a cough over the past few days only in the mornings. Her sinus pressure is located to the maxillary sinuses - pressure is not worse with certain positions. She denies otalgia, fever, chills, N/V/D. She has tried vitamin C and zinc along with her regular allergy medications - veramyst and xyzal. She reports she used to have chronic sinusitis - she had sinus surgery in 2008. She now gets sinusitis approximately 4 times a year.  Review of Systems  Constitutional: Positive for fatigue. Negative for fever and chills.  HENT: Positive for congestion, postnasal drip, rhinorrhea, sinus pressure and sore throat. Negative for ear discharge and ear pain.   Eyes: Negative.   Respiratory: Positive for cough. Negative for shortness of breath and wheezing.   Gastrointestinal: Negative.   Allergic/Immunologic: Positive for environmental allergies.      Objective:   Physical Exam  Constitutional: She is oriented to person, place, and time. She appears well-developed and well-nourished. No distress.  HENT:  Head: Normocephalic and atraumatic.  Right Ear: Hearing, tympanic membrane, external ear and ear canal normal.  Left Ear: Hearing, tympanic membrane, external ear and ear canal normal.  Nose: No mucosal edema. Right sinus exhibits maxillary sinus tenderness. Right sinus exhibits no frontal sinus tenderness. Left sinus exhibits maxillary sinus tenderness. Left sinus exhibits no frontal sinus tenderness.  Mouth/Throat: Uvula is midline and mucous membranes are normal. Posterior oropharyngeal erythema present. No oropharyngeal exudate.  Eyes: Conjunctivae and lids are  normal. Right eye exhibits no discharge. Left eye exhibits no discharge. No scleral icterus.  Cardiovascular: Normal rate, regular rhythm, normal heart sounds and intact distal pulses.   Pulmonary/Chest: Effort normal and breath sounds normal. No respiratory distress. She has no wheezes. She has no rhonchi. She has no rales.  Musculoskeletal: Normal range of motion.  Lymphadenopathy:       Head (right side): No submental, no submandibular, no tonsillar, no preauricular, no posterior auricular and no occipital adenopathy present.       Head (left side): No submental, no submandibular, no tonsillar, no preauricular, no posterior auricular and no occipital adenopathy present.    She has no cervical adenopathy.  Neurological: She is alert and oriented to person, place, and time.  Skin: Skin is warm, dry and intact. No lesion and no rash noted.  Psychiatric: She has a normal mood and affect. Her speech is normal and behavior is normal. Thought content normal.       Assessment & Plan:  1. Acute sinusitis, recurrence not specified, unspecified location 2. Recurrent sinusitis  Patient likely has acute sinusitis. She is allergic to penicillin - levaquin has worked for her in the past - she will use this again. She will continue with her home allergy medications and take ibuprofen/tylenol for her sore throat.  - levofloxacin (LEVAQUIN) 500 MG tablet; Take 1 tablet (500 mg total) by mouth daily.  Dispense: 7 tablet; Refill: 0  Roswell MinersNicole V. Dyke BrackettBush, PA-C, [facility]  11/02/2013

## 2013-11-02 NOTE — Patient Instructions (Signed)
May take tylenol for sore throat. Continue home meds. Return if symptoms are not improved after 7-10 days.

## 2013-11-06 NOTE — Progress Notes (Signed)
I was directly involved with the patient's care and agree with the physical, diagnosis and treatment plan.  

## 2014-03-27 ENCOUNTER — Other Ambulatory Visit: Payer: Self-pay | Admitting: Family Medicine

## 2014-06-25 ENCOUNTER — Other Ambulatory Visit: Payer: Self-pay | Admitting: Family Medicine

## 2014-09-23 ENCOUNTER — Other Ambulatory Visit: Payer: Self-pay | Admitting: Family Medicine

## 2014-10-11 ENCOUNTER — Telehealth: Payer: Self-pay

## 2014-10-11 ENCOUNTER — Other Ambulatory Visit: Payer: Self-pay | Admitting: Family Medicine

## 2014-10-11 MED ORDER — LEVOTHYROXINE SODIUM 25 MCG PO TABS: ORAL_TABLET | ORAL | Status: AC

## 2014-10-11 NOTE — Telephone Encounter (Signed)
Called and spoke with pt and she stated that she didn't realize that she did not have further refills of the thyroid medication.  She stated that she took the last one today.  She will call and set up an appt to come in and be seen to refills of all of her meds.  She is wanting to know if we can send in a refill of the thyroid meds to get her by until she can get in for an appt.  Dr. Milus Glazier please advise. thanks

## 2014-10-11 NOTE — Telephone Encounter (Signed)
Pt is checking on why pts' refill request for her thyroid and xyzal  She is completely out of thyroid and usually does a mail order and she also wants to know how long she can go with out taking it

## 2015-02-14 ENCOUNTER — Other Ambulatory Visit: Payer: Self-pay | Admitting: Family Medicine

## 2015-04-11 ENCOUNTER — Ambulatory Visit (INDEPENDENT_AMBULATORY_CARE_PROVIDER_SITE_OTHER): Payer: BLUE CROSS/BLUE SHIELD

## 2015-04-11 ENCOUNTER — Ambulatory Visit (INDEPENDENT_AMBULATORY_CARE_PROVIDER_SITE_OTHER): Payer: BLUE CROSS/BLUE SHIELD | Admitting: Podiatry

## 2015-04-11 ENCOUNTER — Encounter: Payer: Self-pay | Admitting: Podiatry

## 2015-04-11 DIAGNOSIS — M7741 Metatarsalgia, right foot: Secondary | ICD-10-CM

## 2015-04-11 DIAGNOSIS — M795 Residual foreign body in soft tissue: Secondary | ICD-10-CM

## 2015-04-11 DIAGNOSIS — M779 Enthesopathy, unspecified: Secondary | ICD-10-CM | POA: Diagnosis not present

## 2015-04-11 MED ORDER — TRIAMCINOLONE ACETONIDE 10 MG/ML IJ SUSP
10.0000 mg | Freq: Once | INTRAMUSCULAR | Status: AC
  Administered 2015-04-11: 10 mg

## 2015-04-11 NOTE — Progress Notes (Signed)
   Subjective:    Patient ID: Molly Lozano, female    DOB: [birth year], 40 y.o.   MRN: [id number]  HPI INJURED RT TOP OF THE FOOT AND BEEN HURTING FOR 2 WEEKS. FOOT IS WORSE WHEN WALKING BARE FOOT. TRIED NO TREATMENT.  ALSO, RT FOOT 5TH TOE PULLED SPLINT OUT AND STILL SORE.   Review of Systems  HENT: Positive for sinus pressure.   Respiratory: Positive for shortness of breath.        Objective:   Physical Exam        Assessment & Plan:

## 2015-04-13 NOTE — Progress Notes (Signed)
Subjective:     Patient ID: Molly Lozano, female   DOB: 07/22/1975, 40 y.o.   MRN: [id number]  HPI patient states she's developed a lot of pain on top of the right foot and also she has a painful splinter in the fifth digit that she try to get out but was unable to do and it's been red and swelling for the last 2 weeks   Review of Systems  All other systems reviewed and are negative.      Objective:   Physical Exam  Constitutional: She is oriented to person, place, and time.  Cardiovascular: Intact distal pulses.   Musculoskeletal: Normal range of motion.  Neurological: She is oriented to person, place, and time.  Skin: Skin is warm.  Nursing note and vitals reviewed.  neurovascular status intact muscle strength adequate range of motion within normal limits. Patient's found to have pain in the dorsum of the right foot around the metatarsal shafts and has a area of redness and swelling on the distal tip of the right fifth toe where there is obvious trauma but there is no proximal edema erythema or drainage noted currently. Patient has good digital perfusion well oriented 3     Assessment:     Splinter distal right fifth toe with tendinitis-like symptoms dorsum right    Plan:     H&P condition reviewed and recommended removal of splinter and tendinitis treatment. I went ahead today and also reviewed x-rays and I utilized anesthetic of 60 Milligan times like Marcaine mixture into the right fifth toe and using sterile instrumentation I cleaned the area up and removed a large splinter that was embedded in tissue below skin level. It was localized with no drainage and I flushed the area and applied sterile dressing. I then went ahead and did a tendinous injection right lateral foot 3 mg Kenalog 5 mg Xylocaine and advised on reduced activity and gave instructed instructions on soaks and if any redness any swelling or pain were to occur in her toe or foot she is to let me know  immediately  X-ray report was negative for signs of fracture and negative for signs of foreign body penetration

## 2016-03-09 ENCOUNTER — Other Ambulatory Visit: Payer: Self-pay | Admitting: Orthopaedic Surgery

## 2016-03-09 DIAGNOSIS — M4726 Other spondylosis with radiculopathy, lumbar region: Secondary | ICD-10-CM

## 2016-03-16 ENCOUNTER — Ambulatory Visit
Admission: RE | Admit: 2016-03-16 | Discharge: 2016-03-16 | Disposition: A | Discharge status: Home / Self Care | Payer: BLUE CROSS/BLUE SHIELD | Source: Ambulatory Visit | Attending: Orthopaedic Surgery | Admitting: Orthopaedic Surgery

## 2016-03-16 DIAGNOSIS — M4726 Other spondylosis with radiculopathy, lumbar region: Secondary | ICD-10-CM

## 2016-12-23 DIAGNOSIS — E039 Hypothyroidism, unspecified: Secondary | ICD-10-CM | POA: Diagnosis not present

## 2016-12-31 DIAGNOSIS — E039 Hypothyroidism, unspecified: Secondary | ICD-10-CM | POA: Diagnosis not present

## 2016-12-31 DIAGNOSIS — J069 Acute upper respiratory infection, unspecified: Secondary | ICD-10-CM | POA: Diagnosis not present

## 2016-12-31 DIAGNOSIS — R52 Pain, unspecified: Secondary | ICD-10-CM | POA: Diagnosis not present

## 2016-12-31 DIAGNOSIS — R609 Edema, unspecified: Secondary | ICD-10-CM | POA: Diagnosis not present

## 2017-02-21 DIAGNOSIS — J309 Allergic rhinitis, unspecified: Secondary | ICD-10-CM | POA: Diagnosis not present

## 2017-04-01 DIAGNOSIS — J45909 Unspecified asthma, uncomplicated: Secondary | ICD-10-CM | POA: Diagnosis not present

## 2017-04-01 DIAGNOSIS — K219 Gastro-esophageal reflux disease without esophagitis: Secondary | ICD-10-CM | POA: Diagnosis not present

## 2017-04-01 DIAGNOSIS — K5900 Constipation, unspecified: Secondary | ICD-10-CM | POA: Diagnosis not present

## 2017-04-01 DIAGNOSIS — E039 Hypothyroidism, unspecified: Secondary | ICD-10-CM | POA: Diagnosis not present

## 2017-04-01 DIAGNOSIS — J309 Allergic rhinitis, unspecified: Secondary | ICD-10-CM | POA: Diagnosis not present

## 2017-05-23 DIAGNOSIS — Z0000 Encounter for general adult medical examination without abnormal findings: Secondary | ICD-10-CM | POA: Diagnosis not present

## 2017-05-23 DIAGNOSIS — Z114 Encounter for screening for human immunodeficiency virus [HIV]: Secondary | ICD-10-CM | POA: Diagnosis not present

## 2017-05-23 DIAGNOSIS — Z13220 Encounter for screening for lipoid disorders: Secondary | ICD-10-CM | POA: Diagnosis not present

## 2017-05-23 DIAGNOSIS — Z1329 Encounter for screening for other suspected endocrine disorder: Secondary | ICD-10-CM | POA: Diagnosis not present

## 2017-05-25 DIAGNOSIS — K219 Gastro-esophageal reflux disease without esophagitis: Secondary | ICD-10-CM | POA: Diagnosis not present

## 2017-05-25 DIAGNOSIS — Z0000 Encounter for general adult medical examination without abnormal findings: Secondary | ICD-10-CM | POA: Diagnosis not present

## 2017-05-25 DIAGNOSIS — Z6828 Body mass index (BMI) 28.0-28.9, adult: Secondary | ICD-10-CM | POA: Diagnosis not present

## 2017-05-25 DIAGNOSIS — J309 Allergic rhinitis, unspecified: Secondary | ICD-10-CM | POA: Diagnosis not present

## 2017-05-25 DIAGNOSIS — E039 Hypothyroidism, unspecified: Secondary | ICD-10-CM | POA: Diagnosis not present

## 2017-05-27 DIAGNOSIS — F331 Major depressive disorder, recurrent, moderate: Secondary | ICD-10-CM | POA: Diagnosis not present

## 2017-05-27 DIAGNOSIS — J309 Allergic rhinitis, unspecified: Secondary | ICD-10-CM | POA: Diagnosis not present

## 2017-05-27 DIAGNOSIS — Z6828 Body mass index (BMI) 28.0-28.9, adult: Secondary | ICD-10-CM | POA: Diagnosis not present

## 2017-05-27 DIAGNOSIS — F411 Generalized anxiety disorder: Secondary | ICD-10-CM | POA: Diagnosis not present

## 2017-06-21 DIAGNOSIS — K219 Gastro-esophageal reflux disease without esophagitis: Secondary | ICD-10-CM | POA: Diagnosis not present

## 2017-06-21 DIAGNOSIS — J45909 Unspecified asthma, uncomplicated: Secondary | ICD-10-CM | POA: Diagnosis not present

## 2017-06-21 DIAGNOSIS — F411 Generalized anxiety disorder: Secondary | ICD-10-CM | POA: Diagnosis not present

## 2017-06-21 DIAGNOSIS — J309 Allergic rhinitis, unspecified: Secondary | ICD-10-CM | POA: Diagnosis not present

## 2017-08-19 DIAGNOSIS — J309 Allergic rhinitis, unspecified: Secondary | ICD-10-CM | POA: Diagnosis not present

## 2017-08-19 DIAGNOSIS — Z23 Encounter for immunization: Secondary | ICD-10-CM | POA: Diagnosis not present

## 2017-08-19 DIAGNOSIS — F411 Generalized anxiety disorder: Secondary | ICD-10-CM | POA: Diagnosis not present

## 2017-08-19 DIAGNOSIS — F331 Major depressive disorder, recurrent, moderate: Secondary | ICD-10-CM | POA: Diagnosis not present

## 2017-08-19 DIAGNOSIS — Z6828 Body mass index (BMI) 28.0-28.9, adult: Secondary | ICD-10-CM | POA: Diagnosis not present

## 2017-09-23 DIAGNOSIS — M545 Low back pain: Secondary | ICD-10-CM | POA: Diagnosis not present

## 2017-09-23 DIAGNOSIS — M6281 Muscle weakness (generalized): Secondary | ICD-10-CM | POA: Diagnosis not present

## 2017-09-23 DIAGNOSIS — M2560 Stiffness of unspecified joint, not elsewhere classified: Secondary | ICD-10-CM | POA: Diagnosis not present

## 2017-09-23 DIAGNOSIS — M799 Soft tissue disorder, unspecified: Secondary | ICD-10-CM | POA: Diagnosis not present

## 2017-09-28 DIAGNOSIS — M2560 Stiffness of unspecified joint, not elsewhere classified: Secondary | ICD-10-CM | POA: Diagnosis not present

## 2017-09-28 DIAGNOSIS — M6281 Muscle weakness (generalized): Secondary | ICD-10-CM | POA: Diagnosis not present

## 2017-09-28 DIAGNOSIS — M799 Soft tissue disorder, unspecified: Secondary | ICD-10-CM | POA: Diagnosis not present

## 2017-09-28 DIAGNOSIS — M545 Low back pain: Secondary | ICD-10-CM | POA: Diagnosis not present

## 2017-10-01 DIAGNOSIS — M6281 Muscle weakness (generalized): Secondary | ICD-10-CM | POA: Diagnosis not present

## 2017-10-01 DIAGNOSIS — M2560 Stiffness of unspecified joint, not elsewhere classified: Secondary | ICD-10-CM | POA: Diagnosis not present

## 2017-10-01 DIAGNOSIS — M545 Low back pain: Secondary | ICD-10-CM | POA: Diagnosis not present

## 2017-10-01 DIAGNOSIS — M799 Soft tissue disorder, unspecified: Secondary | ICD-10-CM | POA: Diagnosis not present

## 2017-10-06 DIAGNOSIS — M6281 Muscle weakness (generalized): Secondary | ICD-10-CM | POA: Diagnosis not present

## 2017-10-06 DIAGNOSIS — M545 Low back pain: Secondary | ICD-10-CM | POA: Diagnosis not present

## 2017-10-06 DIAGNOSIS — M2560 Stiffness of unspecified joint, not elsewhere classified: Secondary | ICD-10-CM | POA: Diagnosis not present

## 2017-10-06 DIAGNOSIS — M799 Soft tissue disorder, unspecified: Secondary | ICD-10-CM | POA: Diagnosis not present

## 2017-10-07 DIAGNOSIS — M545 Low back pain: Secondary | ICD-10-CM | POA: Diagnosis not present

## 2017-10-07 DIAGNOSIS — M799 Soft tissue disorder, unspecified: Secondary | ICD-10-CM | POA: Diagnosis not present

## 2017-10-07 DIAGNOSIS — M2560 Stiffness of unspecified joint, not elsewhere classified: Secondary | ICD-10-CM | POA: Diagnosis not present

## 2017-10-07 DIAGNOSIS — M6281 Muscle weakness (generalized): Secondary | ICD-10-CM | POA: Diagnosis not present

## 2017-10-11 DIAGNOSIS — M545 Low back pain: Secondary | ICD-10-CM | POA: Diagnosis not present

## 2017-10-11 DIAGNOSIS — M6281 Muscle weakness (generalized): Secondary | ICD-10-CM | POA: Diagnosis not present

## 2017-10-11 DIAGNOSIS — M799 Soft tissue disorder, unspecified: Secondary | ICD-10-CM | POA: Diagnosis not present

## 2017-10-11 DIAGNOSIS — M2560 Stiffness of unspecified joint, not elsewhere classified: Secondary | ICD-10-CM | POA: Diagnosis not present

## 2017-10-12 DIAGNOSIS — K219 Gastro-esophageal reflux disease without esophagitis: Secondary | ICD-10-CM | POA: Diagnosis not present

## 2017-10-12 DIAGNOSIS — F411 Generalized anxiety disorder: Secondary | ICD-10-CM | POA: Diagnosis not present

## 2017-10-12 DIAGNOSIS — F331 Major depressive disorder, recurrent, moderate: Secondary | ICD-10-CM | POA: Diagnosis not present

## 2017-10-13 DIAGNOSIS — M2560 Stiffness of unspecified joint, not elsewhere classified: Secondary | ICD-10-CM | POA: Diagnosis not present

## 2017-10-13 DIAGNOSIS — M799 Soft tissue disorder, unspecified: Secondary | ICD-10-CM | POA: Diagnosis not present

## 2017-10-13 DIAGNOSIS — M545 Low back pain: Secondary | ICD-10-CM | POA: Diagnosis not present

## 2017-10-13 DIAGNOSIS — M6281 Muscle weakness (generalized): Secondary | ICD-10-CM | POA: Diagnosis not present

## 2017-10-19 DIAGNOSIS — M6281 Muscle weakness (generalized): Secondary | ICD-10-CM | POA: Diagnosis not present

## 2017-10-19 DIAGNOSIS — M545 Low back pain: Secondary | ICD-10-CM | POA: Diagnosis not present

## 2017-10-19 DIAGNOSIS — M2560 Stiffness of unspecified joint, not elsewhere classified: Secondary | ICD-10-CM | POA: Diagnosis not present

## 2017-10-19 DIAGNOSIS — M799 Soft tissue disorder, unspecified: Secondary | ICD-10-CM | POA: Diagnosis not present

## 2017-10-21 DIAGNOSIS — M545 Low back pain: Secondary | ICD-10-CM | POA: Diagnosis not present

## 2017-10-21 DIAGNOSIS — M2560 Stiffness of unspecified joint, not elsewhere classified: Secondary | ICD-10-CM | POA: Diagnosis not present

## 2017-10-21 DIAGNOSIS — M6281 Muscle weakness (generalized): Secondary | ICD-10-CM | POA: Diagnosis not present

## 2017-10-21 DIAGNOSIS — M799 Soft tissue disorder, unspecified: Secondary | ICD-10-CM | POA: Diagnosis not present

## 2017-10-26 DIAGNOSIS — M6281 Muscle weakness (generalized): Secondary | ICD-10-CM | POA: Diagnosis not present

## 2017-10-26 DIAGNOSIS — M545 Low back pain: Secondary | ICD-10-CM | POA: Diagnosis not present

## 2017-10-26 DIAGNOSIS — M2560 Stiffness of unspecified joint, not elsewhere classified: Secondary | ICD-10-CM | POA: Diagnosis not present

## 2017-10-26 DIAGNOSIS — M799 Soft tissue disorder, unspecified: Secondary | ICD-10-CM | POA: Diagnosis not present

## 2017-10-28 DIAGNOSIS — M545 Low back pain: Secondary | ICD-10-CM | POA: Diagnosis not present

## 2017-10-28 DIAGNOSIS — M2560 Stiffness of unspecified joint, not elsewhere classified: Secondary | ICD-10-CM | POA: Diagnosis not present

## 2017-10-28 DIAGNOSIS — M6281 Muscle weakness (generalized): Secondary | ICD-10-CM | POA: Diagnosis not present

## 2017-10-28 DIAGNOSIS — M799 Soft tissue disorder, unspecified: Secondary | ICD-10-CM | POA: Diagnosis not present

## 2017-10-31 DIAGNOSIS — M545 Low back pain: Secondary | ICD-10-CM | POA: Diagnosis not present

## 2017-10-31 DIAGNOSIS — M6281 Muscle weakness (generalized): Secondary | ICD-10-CM | POA: Diagnosis not present

## 2017-10-31 DIAGNOSIS — M799 Soft tissue disorder, unspecified: Secondary | ICD-10-CM | POA: Diagnosis not present

## 2017-10-31 DIAGNOSIS — M2560 Stiffness of unspecified joint, not elsewhere classified: Secondary | ICD-10-CM | POA: Diagnosis not present

## 2017-11-04 DIAGNOSIS — M2560 Stiffness of unspecified joint, not elsewhere classified: Secondary | ICD-10-CM | POA: Diagnosis not present

## 2017-11-04 DIAGNOSIS — M545 Low back pain: Secondary | ICD-10-CM | POA: Diagnosis not present

## 2017-11-04 DIAGNOSIS — M799 Soft tissue disorder, unspecified: Secondary | ICD-10-CM | POA: Diagnosis not present

## 2017-11-04 DIAGNOSIS — M6281 Muscle weakness (generalized): Secondary | ICD-10-CM | POA: Diagnosis not present

## 2017-11-09 DIAGNOSIS — M545 Low back pain: Secondary | ICD-10-CM | POA: Diagnosis not present

## 2017-11-09 DIAGNOSIS — M2560 Stiffness of unspecified joint, not elsewhere classified: Secondary | ICD-10-CM | POA: Diagnosis not present

## 2017-11-09 DIAGNOSIS — M6281 Muscle weakness (generalized): Secondary | ICD-10-CM | POA: Diagnosis not present

## 2017-11-09 DIAGNOSIS — M799 Soft tissue disorder, unspecified: Secondary | ICD-10-CM | POA: Diagnosis not present

## 2017-11-11 DIAGNOSIS — M799 Soft tissue disorder, unspecified: Secondary | ICD-10-CM | POA: Diagnosis not present

## 2017-11-11 DIAGNOSIS — M6281 Muscle weakness (generalized): Secondary | ICD-10-CM | POA: Diagnosis not present

## 2017-11-11 DIAGNOSIS — M2560 Stiffness of unspecified joint, not elsewhere classified: Secondary | ICD-10-CM | POA: Diagnosis not present

## 2017-11-11 DIAGNOSIS — M545 Low back pain: Secondary | ICD-10-CM | POA: Diagnosis not present

## 2017-11-16 DIAGNOSIS — M2560 Stiffness of unspecified joint, not elsewhere classified: Secondary | ICD-10-CM | POA: Diagnosis not present

## 2017-11-16 DIAGNOSIS — M6281 Muscle weakness (generalized): Secondary | ICD-10-CM | POA: Diagnosis not present

## 2017-11-16 DIAGNOSIS — M799 Soft tissue disorder, unspecified: Secondary | ICD-10-CM | POA: Diagnosis not present

## 2017-11-16 DIAGNOSIS — M545 Low back pain: Secondary | ICD-10-CM | POA: Diagnosis not present

## 2017-11-18 DIAGNOSIS — M799 Soft tissue disorder, unspecified: Secondary | ICD-10-CM | POA: Diagnosis not present

## 2017-11-18 DIAGNOSIS — M2560 Stiffness of unspecified joint, not elsewhere classified: Secondary | ICD-10-CM | POA: Diagnosis not present

## 2017-11-18 DIAGNOSIS — M6281 Muscle weakness (generalized): Secondary | ICD-10-CM | POA: Diagnosis not present

## 2017-11-18 DIAGNOSIS — M545 Low back pain: Secondary | ICD-10-CM | POA: Diagnosis not present

## 2017-11-22 DIAGNOSIS — M6281 Muscle weakness (generalized): Secondary | ICD-10-CM | POA: Diagnosis not present

## 2017-11-22 DIAGNOSIS — M2560 Stiffness of unspecified joint, not elsewhere classified: Secondary | ICD-10-CM | POA: Diagnosis not present

## 2017-11-22 DIAGNOSIS — M545 Low back pain: Secondary | ICD-10-CM | POA: Diagnosis not present

## 2017-11-22 DIAGNOSIS — M799 Soft tissue disorder, unspecified: Secondary | ICD-10-CM | POA: Diagnosis not present

## 2017-11-23 DIAGNOSIS — Z01411 Encounter for gynecological examination (general) (routine) with abnormal findings: Secondary | ICD-10-CM | POA: Diagnosis not present

## 2017-11-23 DIAGNOSIS — Z1151 Encounter for screening for human papillomavirus (HPV): Secondary | ICD-10-CM | POA: Diagnosis not present

## 2017-11-23 DIAGNOSIS — Z1231 Encounter for screening mammogram for malignant neoplasm of breast: Secondary | ICD-10-CM | POA: Diagnosis not present

## 2017-11-23 DIAGNOSIS — Z01419 Encounter for gynecological examination (general) (routine) without abnormal findings: Secondary | ICD-10-CM | POA: Diagnosis not present

## 2017-11-23 DIAGNOSIS — R5383 Other fatigue: Secondary | ICD-10-CM | POA: Diagnosis not present

## 2017-11-23 DIAGNOSIS — N809 Endometriosis, unspecified: Secondary | ICD-10-CM | POA: Diagnosis not present

## 2017-11-24 DIAGNOSIS — M799 Soft tissue disorder, unspecified: Secondary | ICD-10-CM | POA: Diagnosis not present

## 2017-11-24 DIAGNOSIS — M545 Low back pain: Secondary | ICD-10-CM | POA: Diagnosis not present

## 2017-11-24 DIAGNOSIS — M6281 Muscle weakness (generalized): Secondary | ICD-10-CM | POA: Diagnosis not present

## 2017-11-24 DIAGNOSIS — M2560 Stiffness of unspecified joint, not elsewhere classified: Secondary | ICD-10-CM | POA: Diagnosis not present

## 2017-12-05 DIAGNOSIS — M799 Soft tissue disorder, unspecified: Secondary | ICD-10-CM | POA: Diagnosis not present

## 2017-12-05 DIAGNOSIS — M6281 Muscle weakness (generalized): Secondary | ICD-10-CM | POA: Diagnosis not present

## 2017-12-05 DIAGNOSIS — M2560 Stiffness of unspecified joint, not elsewhere classified: Secondary | ICD-10-CM | POA: Diagnosis not present

## 2017-12-05 DIAGNOSIS — M545 Low back pain: Secondary | ICD-10-CM | POA: Diagnosis not present

## 2017-12-07 DIAGNOSIS — M799 Soft tissue disorder, unspecified: Secondary | ICD-10-CM | POA: Diagnosis not present

## 2017-12-07 DIAGNOSIS — M6281 Muscle weakness (generalized): Secondary | ICD-10-CM | POA: Diagnosis not present

## 2017-12-07 DIAGNOSIS — M2560 Stiffness of unspecified joint, not elsewhere classified: Secondary | ICD-10-CM | POA: Diagnosis not present

## 2017-12-07 DIAGNOSIS — M545 Low back pain: Secondary | ICD-10-CM | POA: Diagnosis not present

## 2017-12-08 DIAGNOSIS — Z3141 Encounter for fertility testing: Secondary | ICD-10-CM | POA: Diagnosis not present

## 2017-12-12 DIAGNOSIS — M6281 Muscle weakness (generalized): Secondary | ICD-10-CM | POA: Diagnosis not present

## 2017-12-12 DIAGNOSIS — M2560 Stiffness of unspecified joint, not elsewhere classified: Secondary | ICD-10-CM | POA: Diagnosis not present

## 2017-12-12 DIAGNOSIS — M545 Low back pain: Secondary | ICD-10-CM | POA: Diagnosis not present

## 2017-12-12 DIAGNOSIS — M799 Soft tissue disorder, unspecified: Secondary | ICD-10-CM | POA: Diagnosis not present

## 2017-12-13 DIAGNOSIS — E039 Hypothyroidism, unspecified: Secondary | ICD-10-CM | POA: Diagnosis not present

## 2017-12-13 DIAGNOSIS — E559 Vitamin D deficiency, unspecified: Secondary | ICD-10-CM | POA: Diagnosis not present

## 2017-12-13 DIAGNOSIS — Z3169 Encounter for other general counseling and advice on procreation: Secondary | ICD-10-CM | POA: Diagnosis not present

## 2017-12-15 DIAGNOSIS — Z6829 Body mass index (BMI) 29.0-29.9, adult: Secondary | ICD-10-CM | POA: Diagnosis not present

## 2017-12-15 DIAGNOSIS — J449 Chronic obstructive pulmonary disease, unspecified: Secondary | ICD-10-CM | POA: Diagnosis not present

## 2017-12-15 DIAGNOSIS — E039 Hypothyroidism, unspecified: Secondary | ICD-10-CM | POA: Diagnosis not present

## 2017-12-15 DIAGNOSIS — J45990 Exercise induced bronchospasm: Secondary | ICD-10-CM | POA: Diagnosis not present

## 2017-12-16 DIAGNOSIS — M545 Low back pain: Secondary | ICD-10-CM | POA: Diagnosis not present

## 2017-12-16 DIAGNOSIS — M799 Soft tissue disorder, unspecified: Secondary | ICD-10-CM | POA: Diagnosis not present

## 2017-12-16 DIAGNOSIS — M2560 Stiffness of unspecified joint, not elsewhere classified: Secondary | ICD-10-CM | POA: Diagnosis not present

## 2017-12-16 DIAGNOSIS — M6281 Muscle weakness (generalized): Secondary | ICD-10-CM | POA: Diagnosis not present

## 2017-12-19 DIAGNOSIS — M2560 Stiffness of unspecified joint, not elsewhere classified: Secondary | ICD-10-CM | POA: Diagnosis not present

## 2017-12-19 DIAGNOSIS — M6281 Muscle weakness (generalized): Secondary | ICD-10-CM | POA: Diagnosis not present

## 2017-12-19 DIAGNOSIS — M799 Soft tissue disorder, unspecified: Secondary | ICD-10-CM | POA: Diagnosis not present

## 2017-12-19 DIAGNOSIS — M545 Low back pain: Secondary | ICD-10-CM | POA: Diagnosis not present

## 2017-12-21 DIAGNOSIS — M6281 Muscle weakness (generalized): Secondary | ICD-10-CM | POA: Diagnosis not present

## 2017-12-21 DIAGNOSIS — M799 Soft tissue disorder, unspecified: Secondary | ICD-10-CM | POA: Diagnosis not present

## 2017-12-21 DIAGNOSIS — M2560 Stiffness of unspecified joint, not elsewhere classified: Secondary | ICD-10-CM | POA: Diagnosis not present

## 2017-12-21 DIAGNOSIS — M545 Low back pain: Secondary | ICD-10-CM | POA: Diagnosis not present

## 2017-12-30 DIAGNOSIS — M799 Soft tissue disorder, unspecified: Secondary | ICD-10-CM | POA: Diagnosis not present

## 2017-12-30 DIAGNOSIS — M545 Low back pain: Secondary | ICD-10-CM | POA: Diagnosis not present

## 2017-12-30 DIAGNOSIS — M2560 Stiffness of unspecified joint, not elsewhere classified: Secondary | ICD-10-CM | POA: Diagnosis not present

## 2017-12-30 DIAGNOSIS — M6281 Muscle weakness (generalized): Secondary | ICD-10-CM | POA: Diagnosis not present

## 2018-01-02 DIAGNOSIS — M799 Soft tissue disorder, unspecified: Secondary | ICD-10-CM | POA: Diagnosis not present

## 2018-01-02 DIAGNOSIS — M2560 Stiffness of unspecified joint, not elsewhere classified: Secondary | ICD-10-CM | POA: Diagnosis not present

## 2018-01-02 DIAGNOSIS — M6281 Muscle weakness (generalized): Secondary | ICD-10-CM | POA: Diagnosis not present

## 2018-01-02 DIAGNOSIS — M545 Low back pain: Secondary | ICD-10-CM | POA: Diagnosis not present

## 2018-01-05 DIAGNOSIS — G894 Chronic pain syndrome: Secondary | ICD-10-CM | POA: Diagnosis not present

## 2018-01-10 DIAGNOSIS — M2560 Stiffness of unspecified joint, not elsewhere classified: Secondary | ICD-10-CM | POA: Diagnosis not present

## 2018-01-10 DIAGNOSIS — M799 Soft tissue disorder, unspecified: Secondary | ICD-10-CM | POA: Diagnosis not present

## 2018-01-10 DIAGNOSIS — M545 Low back pain: Secondary | ICD-10-CM | POA: Diagnosis not present

## 2018-01-10 DIAGNOSIS — M6281 Muscle weakness (generalized): Secondary | ICD-10-CM | POA: Diagnosis not present

## 2018-01-11 DIAGNOSIS — J019 Acute sinusitis, unspecified: Secondary | ICD-10-CM | POA: Diagnosis not present

## 2018-01-11 DIAGNOSIS — Z6829 Body mass index (BMI) 29.0-29.9, adult: Secondary | ICD-10-CM | POA: Diagnosis not present

## 2018-01-11 DIAGNOSIS — J069 Acute upper respiratory infection, unspecified: Secondary | ICD-10-CM | POA: Diagnosis not present

## 2018-01-11 DIAGNOSIS — J309 Allergic rhinitis, unspecified: Secondary | ICD-10-CM | POA: Diagnosis not present

## 2018-01-12 DIAGNOSIS — M6281 Muscle weakness (generalized): Secondary | ICD-10-CM | POA: Diagnosis not present

## 2018-01-12 DIAGNOSIS — M545 Low back pain: Secondary | ICD-10-CM | POA: Diagnosis not present

## 2018-01-12 DIAGNOSIS — M2560 Stiffness of unspecified joint, not elsewhere classified: Secondary | ICD-10-CM | POA: Diagnosis not present

## 2018-01-12 DIAGNOSIS — M799 Soft tissue disorder, unspecified: Secondary | ICD-10-CM | POA: Diagnosis not present

## 2018-01-16 DIAGNOSIS — M799 Soft tissue disorder, unspecified: Secondary | ICD-10-CM | POA: Diagnosis not present

## 2018-01-16 DIAGNOSIS — M6281 Muscle weakness (generalized): Secondary | ICD-10-CM | POA: Diagnosis not present

## 2018-01-16 DIAGNOSIS — M2560 Stiffness of unspecified joint, not elsewhere classified: Secondary | ICD-10-CM | POA: Diagnosis not present

## 2018-01-16 DIAGNOSIS — M545 Low back pain: Secondary | ICD-10-CM | POA: Diagnosis not present

## 2018-01-18 DIAGNOSIS — M2560 Stiffness of unspecified joint, not elsewhere classified: Secondary | ICD-10-CM | POA: Diagnosis not present

## 2018-01-18 DIAGNOSIS — M799 Soft tissue disorder, unspecified: Secondary | ICD-10-CM | POA: Diagnosis not present

## 2018-01-18 DIAGNOSIS — M6281 Muscle weakness (generalized): Secondary | ICD-10-CM | POA: Diagnosis not present

## 2018-01-18 DIAGNOSIS — M545 Low back pain: Secondary | ICD-10-CM | POA: Diagnosis not present

## 2018-01-23 DIAGNOSIS — M2560 Stiffness of unspecified joint, not elsewhere classified: Secondary | ICD-10-CM | POA: Diagnosis not present

## 2018-01-23 DIAGNOSIS — M545 Low back pain: Secondary | ICD-10-CM | POA: Diagnosis not present

## 2018-01-23 DIAGNOSIS — M6281 Muscle weakness (generalized): Secondary | ICD-10-CM | POA: Diagnosis not present

## 2018-01-23 DIAGNOSIS — M799 Soft tissue disorder, unspecified: Secondary | ICD-10-CM | POA: Diagnosis not present

## 2018-01-25 DIAGNOSIS — M6281 Muscle weakness (generalized): Secondary | ICD-10-CM | POA: Diagnosis not present

## 2018-01-25 DIAGNOSIS — M545 Low back pain: Secondary | ICD-10-CM | POA: Diagnosis not present

## 2018-01-25 DIAGNOSIS — M799 Soft tissue disorder, unspecified: Secondary | ICD-10-CM | POA: Diagnosis not present

## 2018-01-25 DIAGNOSIS — M2560 Stiffness of unspecified joint, not elsewhere classified: Secondary | ICD-10-CM | POA: Diagnosis not present

## 2018-01-30 DIAGNOSIS — M6281 Muscle weakness (generalized): Secondary | ICD-10-CM | POA: Diagnosis not present

## 2018-01-30 DIAGNOSIS — M545 Low back pain: Secondary | ICD-10-CM | POA: Diagnosis not present

## 2018-01-30 DIAGNOSIS — M799 Soft tissue disorder, unspecified: Secondary | ICD-10-CM | POA: Diagnosis not present

## 2018-01-30 DIAGNOSIS — M2560 Stiffness of unspecified joint, not elsewhere classified: Secondary | ICD-10-CM | POA: Diagnosis not present

## 2018-02-01 DIAGNOSIS — M545 Low back pain: Secondary | ICD-10-CM | POA: Diagnosis not present

## 2018-02-01 DIAGNOSIS — M6281 Muscle weakness (generalized): Secondary | ICD-10-CM | POA: Diagnosis not present

## 2018-02-01 DIAGNOSIS — M2560 Stiffness of unspecified joint, not elsewhere classified: Secondary | ICD-10-CM | POA: Diagnosis not present

## 2018-02-01 DIAGNOSIS — M799 Soft tissue disorder, unspecified: Secondary | ICD-10-CM | POA: Diagnosis not present

## 2018-02-06 DIAGNOSIS — M799 Soft tissue disorder, unspecified: Secondary | ICD-10-CM | POA: Diagnosis not present

## 2018-02-06 DIAGNOSIS — M6281 Muscle weakness (generalized): Secondary | ICD-10-CM | POA: Diagnosis not present

## 2018-02-06 DIAGNOSIS — M2560 Stiffness of unspecified joint, not elsewhere classified: Secondary | ICD-10-CM | POA: Diagnosis not present

## 2018-02-06 DIAGNOSIS — M545 Low back pain: Secondary | ICD-10-CM | POA: Diagnosis not present

## 2018-02-08 DIAGNOSIS — M545 Low back pain: Secondary | ICD-10-CM | POA: Diagnosis not present

## 2018-02-08 DIAGNOSIS — M2560 Stiffness of unspecified joint, not elsewhere classified: Secondary | ICD-10-CM | POA: Diagnosis not present

## 2018-02-08 DIAGNOSIS — M799 Soft tissue disorder, unspecified: Secondary | ICD-10-CM | POA: Diagnosis not present

## 2018-02-08 DIAGNOSIS — M6281 Muscle weakness (generalized): Secondary | ICD-10-CM | POA: Diagnosis not present

## 2018-03-28 DIAGNOSIS — F331 Major depressive disorder, recurrent, moderate: Secondary | ICD-10-CM | POA: Diagnosis not present

## 2018-03-28 DIAGNOSIS — F411 Generalized anxiety disorder: Secondary | ICD-10-CM | POA: Diagnosis not present

## 2018-03-28 DIAGNOSIS — K5900 Constipation, unspecified: Secondary | ICD-10-CM | POA: Diagnosis not present

## 2018-03-28 DIAGNOSIS — E039 Hypothyroidism, unspecified: Secondary | ICD-10-CM | POA: Diagnosis not present

## 2018-04-04 DIAGNOSIS — F331 Major depressive disorder, recurrent, moderate: Secondary | ICD-10-CM | POA: Diagnosis not present

## 2018-04-04 DIAGNOSIS — J309 Allergic rhinitis, unspecified: Secondary | ICD-10-CM | POA: Diagnosis not present

## 2018-04-04 DIAGNOSIS — K5900 Constipation, unspecified: Secondary | ICD-10-CM | POA: Diagnosis not present

## 2018-04-04 DIAGNOSIS — E039 Hypothyroidism, unspecified: Secondary | ICD-10-CM | POA: Diagnosis not present

## 2018-04-06 DIAGNOSIS — G894 Chronic pain syndrome: Secondary | ICD-10-CM | POA: Diagnosis not present

## 2018-04-06 DIAGNOSIS — M5416 Radiculopathy, lumbar region: Secondary | ICD-10-CM | POA: Diagnosis not present

## 2018-04-06 DIAGNOSIS — M5136 Other intervertebral disc degeneration, lumbar region: Secondary | ICD-10-CM | POA: Diagnosis not present

## 2018-06-27 DIAGNOSIS — Z0000 Encounter for general adult medical examination without abnormal findings: Secondary | ICD-10-CM | POA: Diagnosis not present

## 2018-06-27 DIAGNOSIS — Z6829 Body mass index (BMI) 29.0-29.9, adult: Secondary | ICD-10-CM | POA: Diagnosis not present

## 2018-07-05 DIAGNOSIS — R5383 Other fatigue: Secondary | ICD-10-CM | POA: Diagnosis not present

## 2018-07-05 DIAGNOSIS — K219 Gastro-esophageal reflux disease without esophagitis: Secondary | ICD-10-CM | POA: Diagnosis not present

## 2018-07-05 DIAGNOSIS — G4700 Insomnia, unspecified: Secondary | ICD-10-CM | POA: Diagnosis not present

## 2018-07-17 ENCOUNTER — Other Ambulatory Visit: Payer: Self-pay | Admitting: Orthopaedic Surgery

## 2018-07-17 DIAGNOSIS — M5416 Radiculopathy, lumbar region: Secondary | ICD-10-CM | POA: Diagnosis not present

## 2018-07-17 DIAGNOSIS — M5136 Other intervertebral disc degeneration, lumbar region: Secondary | ICD-10-CM | POA: Diagnosis not present

## 2018-07-17 DIAGNOSIS — Z6828 Body mass index (BMI) 28.0-28.9, adult: Secondary | ICD-10-CM | POA: Diagnosis not present

## 2018-07-17 DIAGNOSIS — M545 Low back pain: Secondary | ICD-10-CM | POA: Diagnosis not present

## 2018-07-17 DIAGNOSIS — G894 Chronic pain syndrome: Secondary | ICD-10-CM | POA: Diagnosis not present

## 2018-08-08 ENCOUNTER — Other Ambulatory Visit: Payer: Self-pay

## 2018-08-08 ENCOUNTER — Ambulatory Visit
Admission: RE | Admit: 2018-08-08 | Discharge: 2018-08-08 | Disposition: A | Discharge status: Home / Self Care | Payer: BC Managed Care – PPO | Source: Ambulatory Visit | Attending: Orthopaedic Surgery | Admitting: Orthopaedic Surgery

## 2018-08-08 DIAGNOSIS — M5136 Other intervertebral disc degeneration, lumbar region: Secondary | ICD-10-CM

## 2018-08-09 DIAGNOSIS — F331 Major depressive disorder, recurrent, moderate: Secondary | ICD-10-CM | POA: Diagnosis not present

## 2018-08-09 DIAGNOSIS — F411 Generalized anxiety disorder: Secondary | ICD-10-CM | POA: Diagnosis not present

## 2018-08-09 DIAGNOSIS — R5383 Other fatigue: Secondary | ICD-10-CM | POA: Diagnosis not present

## 2018-08-09 DIAGNOSIS — G4700 Insomnia, unspecified: Secondary | ICD-10-CM | POA: Diagnosis not present

## 2018-08-11 DIAGNOSIS — M5136 Other intervertebral disc degeneration, lumbar region: Secondary | ICD-10-CM | POA: Diagnosis not present

## 2018-08-11 DIAGNOSIS — G894 Chronic pain syndrome: Secondary | ICD-10-CM | POA: Diagnosis not present

## 2018-08-11 DIAGNOSIS — M4726 Other spondylosis with radiculopathy, lumbar region: Secondary | ICD-10-CM | POA: Diagnosis not present

## 2018-09-15 DIAGNOSIS — M5136 Other intervertebral disc degeneration, lumbar region: Secondary | ICD-10-CM | POA: Diagnosis not present

## 2018-09-15 DIAGNOSIS — M7918 Myalgia, other site: Secondary | ICD-10-CM | POA: Diagnosis not present

## 2018-09-15 DIAGNOSIS — G894 Chronic pain syndrome: Secondary | ICD-10-CM | POA: Diagnosis not present

## 2018-10-11 DIAGNOSIS — Z23 Encounter for immunization: Secondary | ICD-10-CM | POA: Diagnosis not present

## 2018-11-29 DIAGNOSIS — Z1231 Encounter for screening mammogram for malignant neoplasm of breast: Secondary | ICD-10-CM | POA: Diagnosis not present

## 2018-11-29 DIAGNOSIS — Z01411 Encounter for gynecological examination (general) (routine) with abnormal findings: Secondary | ICD-10-CM | POA: Diagnosis not present

## 2018-11-29 DIAGNOSIS — Z6827 Body mass index (BMI) 27.0-27.9, adult: Secondary | ICD-10-CM | POA: Diagnosis not present

## 2018-11-29 DIAGNOSIS — Z3149 Encounter for other procreative investigation and testing: Secondary | ICD-10-CM | POA: Diagnosis not present

## 2018-11-29 DIAGNOSIS — N809 Endometriosis, unspecified: Secondary | ICD-10-CM | POA: Diagnosis not present

## 2019-01-02 DIAGNOSIS — E039 Hypothyroidism, unspecified: Secondary | ICD-10-CM | POA: Diagnosis not present

## 2019-01-02 DIAGNOSIS — J309 Allergic rhinitis, unspecified: Secondary | ICD-10-CM | POA: Diagnosis not present

## 2019-01-02 DIAGNOSIS — E559 Vitamin D deficiency, unspecified: Secondary | ICD-10-CM | POA: Diagnosis not present

## 2019-01-02 DIAGNOSIS — F411 Generalized anxiety disorder: Secondary | ICD-10-CM | POA: Diagnosis not present

## 2019-04-01 ENCOUNTER — Ambulatory Visit: Payer: 59 | Attending: Internal Medicine

## 2019-04-01 DIAGNOSIS — Z23 Encounter for immunization: Secondary | ICD-10-CM

## 2019-04-01 NOTE — Progress Notes (Signed)
   Covid-19 Vaccination Clinic  Name:  Molly Lozano    MRN: [id number] DOB: [birth year]  04/01/2019  [name] was observed post Covid-19 immunization for 30 minutes based on pre-vaccination screening without incident. She was provided with Vaccine Information Sheet and instruction to access the V-Safe system.   [name] was instructed to call 911 with any severe reactions post vaccine: Marland Kitchen Difficulty breathing  . Swelling of face and throat  . A fast heartbeat  . A bad rash all over body  . Dizziness and weakness   Immunizations Administered    Name Date Dose VIS Date Route   Pfizer COVID-19 Vaccine 04/01/2019 12:54 PM 0.3 mL 12/15/2018 Intramuscular   Manufacturer: [employer], [employer]   Lot: [id number]   NDC: [address]-8

## 2019-04-03 ENCOUNTER — Ambulatory Visit: Payer: 59

## 2019-04-25 ENCOUNTER — Ambulatory Visit: Payer: 59 | Attending: Internal Medicine

## 2019-04-25 DIAGNOSIS — Z23 Encounter for immunization: Secondary | ICD-10-CM

## 2019-04-25 NOTE — Progress Notes (Addendum)
   Covid-19 Vaccination Clinic  Name:  Molly Lozano    MRN: [id number] DOB: February 11, 1975  04/25/2019  Molly Lozano was observed post Covid-19 immunization for 30 minutes based on pre-vaccination screening .  During the observation period, she experienced an adverse reaction with the following symptoms:  Itchy throat and a " feeling of pressure to her throat"..  Assessment : Time of assessment 356pm. Alert and oriented and no visual sweeling to face or throat.  Actions taken:  Vitals sign taken  VAERS form obtained and completed by RN. Family member to transport patient home. 330pm- 137/90, P- 61, O2- 100%, 335PM_ bp- 129/81, p- 64, o2- 100%, AT 340- bp- 130/91, p- 56, o2- 100 %, 407PM- bp- 113/97, p- 61, o2- 100%, 450PM- 132/81, p- 60, o2- 100%    Medications administered: Benadryl 25 mg 1 po x2 at 330pm and 354pm, famottidine 20 mg 1 po x 1 at 427pm  Disposition: Reports no further symptoms of adverse reaction after observation for 60 , patient is doing virtual visit with PCP while in clinic at 456pm. minutes. Discharged home. Instructed to follow up with PCP  for evaluation for second dose.   Immunizations Administered    Name Date Dose VIS Date Route   Pfizer COVID-19 Vaccine 04/25/2019  2:49 PM 0.3 mL 02/28/2018 Intramuscular   Manufacturer: [employer], [employer]   Lot: [id number]   NDC: [address]-1

## 2019-04-26 ENCOUNTER — Encounter: Payer: Self-pay | Admitting: *Deleted

## 2019-04-26 ENCOUNTER — Telehealth: Payer: Self-pay | Admitting: *Deleted

## 2019-04-26 NOTE — Telephone Encounter (Signed)
Called and left a voicemail for the patient asking to call back in regards to her second COVID vaccine that she received yesterday. Asked patient to call back in regards to any worsening symptoms or with questions or concerns. MyChart message sent. Will follow up.

## 2019-05-04 NOTE — Telephone Encounter (Signed)
Letter mailed

## 2019-08-23 ENCOUNTER — Encounter: Payer: Self-pay | Admitting: Genetic Counselor

## 2020-12-17 ENCOUNTER — Other Ambulatory Visit: Payer: Self-pay | Admitting: Rehabilitation

## 2020-12-17 DIAGNOSIS — M5416 Radiculopathy, lumbar region: Secondary | ICD-10-CM

## 2020-12-17 DIAGNOSIS — M5127 Other intervertebral disc displacement, lumbosacral region: Secondary | ICD-10-CM

## 2020-12-17 DIAGNOSIS — M5136 Other intervertebral disc degeneration, lumbar region: Secondary | ICD-10-CM

## 2021-01-09 ENCOUNTER — Ambulatory Visit
Admission: RE | Admit: 2021-01-09 | Discharge: 2021-01-09 | Disposition: A | Discharge status: Home / Self Care | Payer: Self-pay | Source: Ambulatory Visit | Attending: Rehabilitation | Admitting: Rehabilitation

## 2021-01-09 DIAGNOSIS — M5416 Radiculopathy, lumbar region: Secondary | ICD-10-CM

## 2021-01-09 DIAGNOSIS — M5136 Other intervertebral disc degeneration, lumbar region: Secondary | ICD-10-CM

## 2021-01-09 DIAGNOSIS — M5127 Other intervertebral disc displacement, lumbosacral region: Secondary | ICD-10-CM

## 2021-07-02 IMAGING — MR MRI LUMBAR SPINE WITHOUT CONTRAST
4 of 5 series · 27 of 48 positions shown · non-contrast
Comparison: Lumbar spine MRI 03/16/2016

CLINICAL DATA: Degeneration of lumbar intervertebral disc.
Additional history: Patient states performing manual labor 3 weeks
ago and at the end of day having severe back pain centered slightly
to the left, some right leg pain at times.

EXAM:
MRI LUMBAR SPINE WITHOUT CONTRAST
TECHNIQUE: Multiplanar, multisequence MR imaging of the lumbar spine was
performed. No intravenous contrast was administered.

[Series 2: T2 · sagittal · 4.0mm · 1.09mm/px · 6 of 16 slices shown (1 of 2)]
[im 1/16]
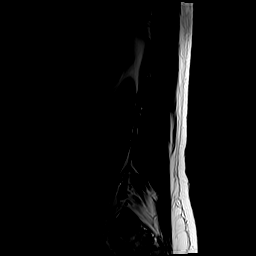
[im 4/16]
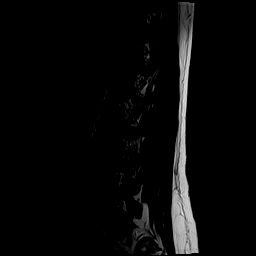
[im 7/16]
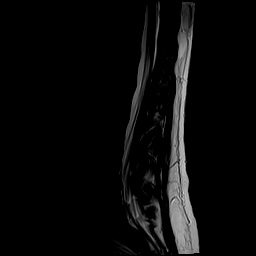
[im 10/16]
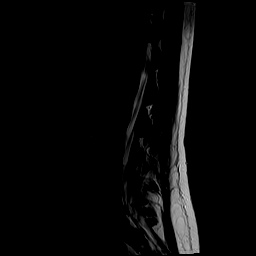
[im 13/16]
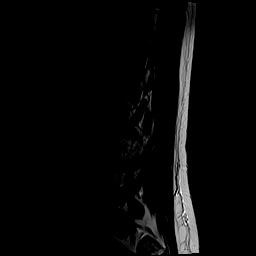
[im 16/16]
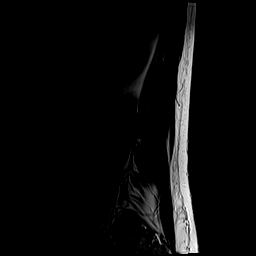

[Series 4: T1 · sagittal · 4.0mm · 1.09mm/px · 6 of 16 slices shown (1 of 2)]
[im 1/16]
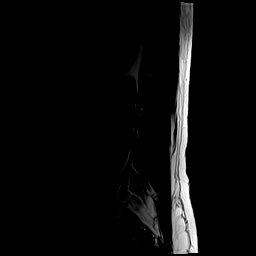
[im 4/16]
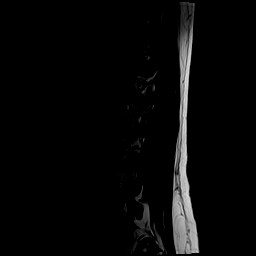
[im 7/16]
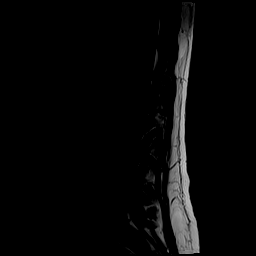
[im 10/16]
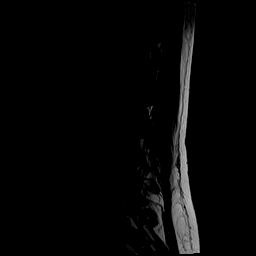
[im 13/16]
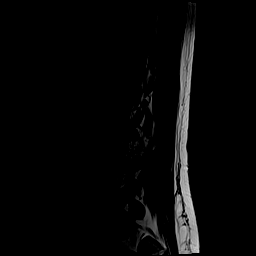
[im 16/16]
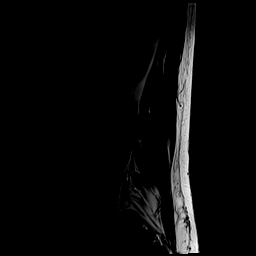

[Series 5: T2 · axial · 4.0mm · 0.39mm/px · z∈[-147,+72]mm · 9 of 41 slices shown (2 of 2)]
[im 1/41]
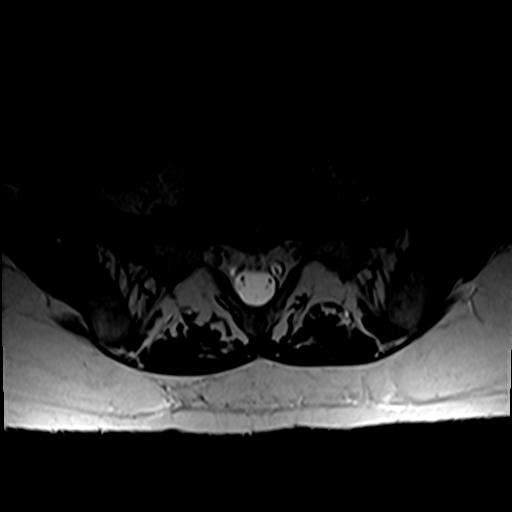
[im 6/41]
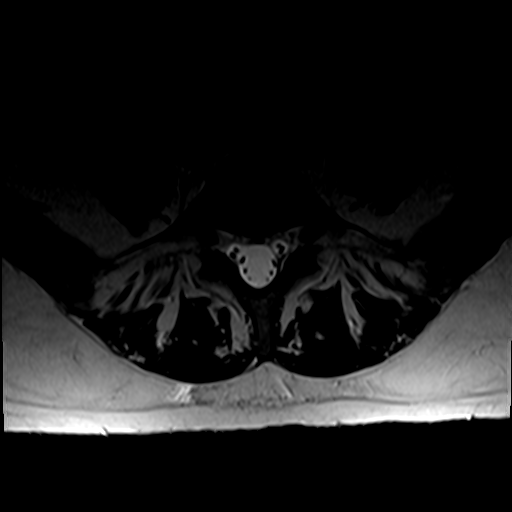
[im 12/41]
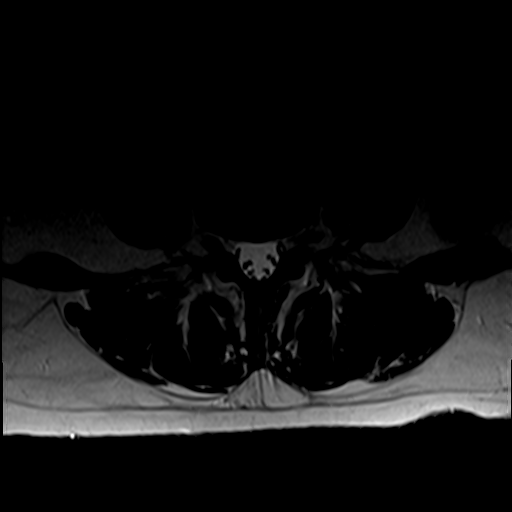
[im 18/41]
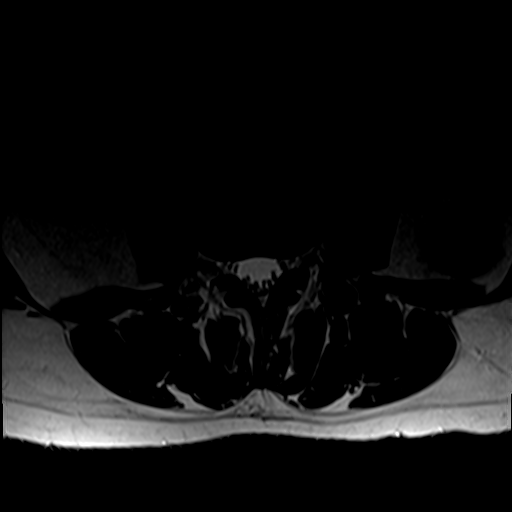
[im 21/41]
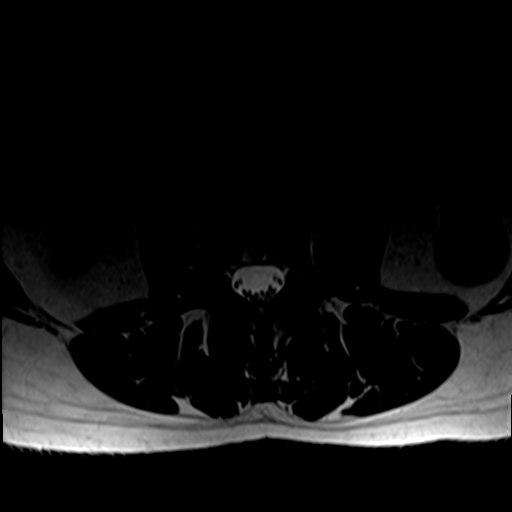
[im 23/41]
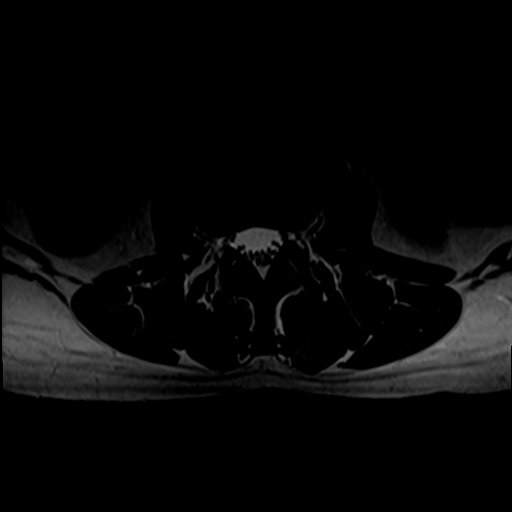
[im 29/41]
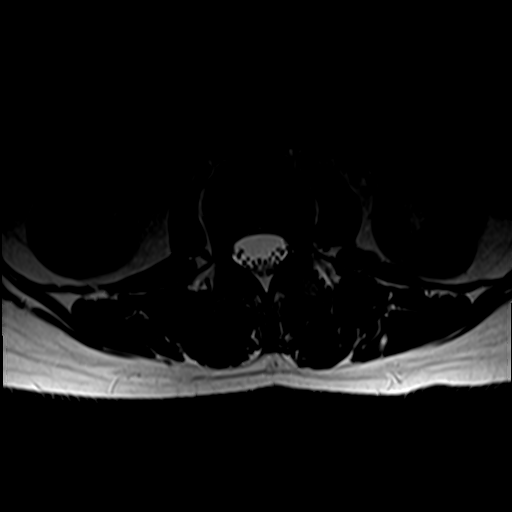
[im 35/41]
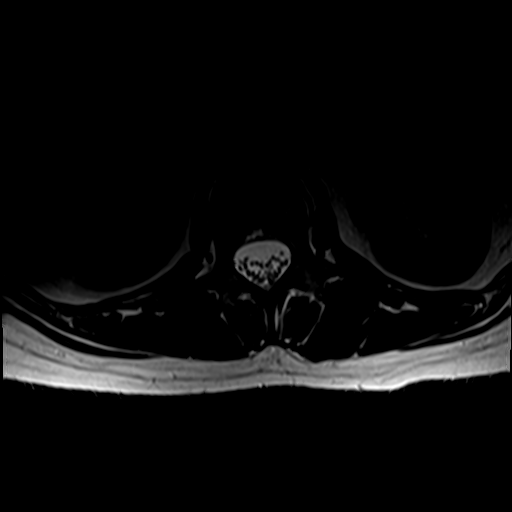
[im 41/41]
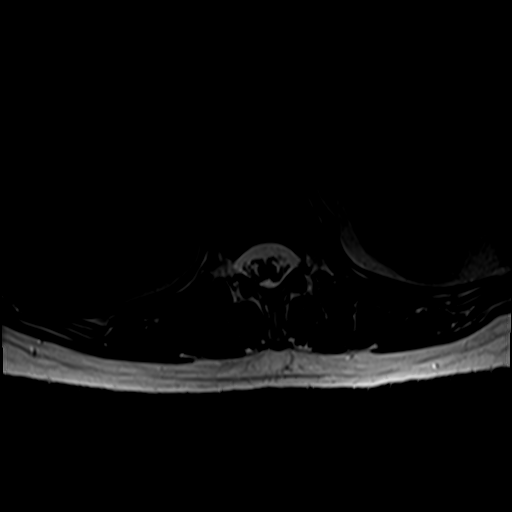

[Series 6: T1 · axial · 4.0mm · 0.39mm/px · z∈[-147,+43]mm · 6 of 41 slices shown (2 of 2)]
[im 1/41]
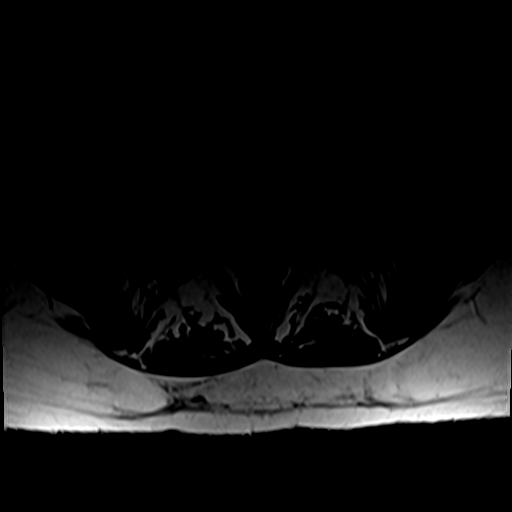
[im 6/41]
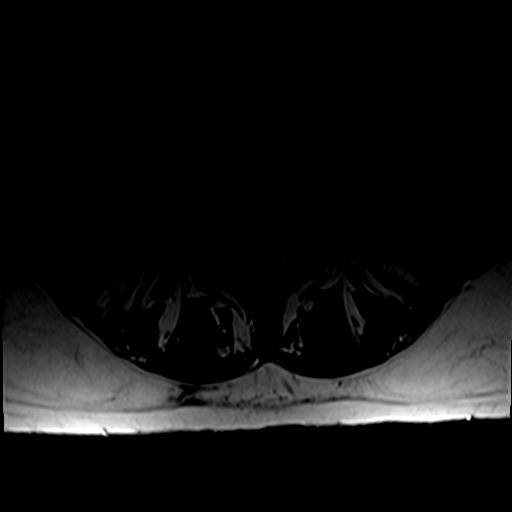
[im 12/41]
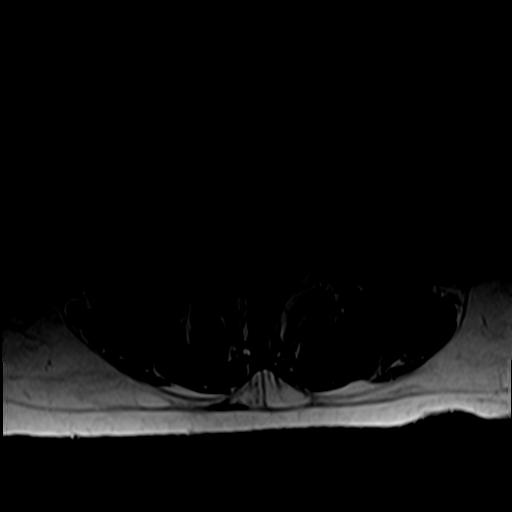
[im 18/41]
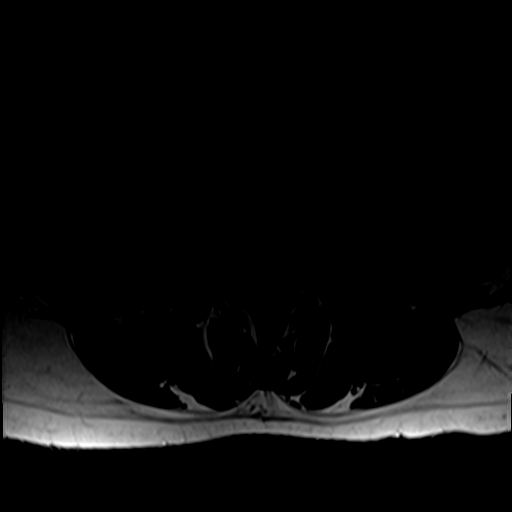
[im 21/41]
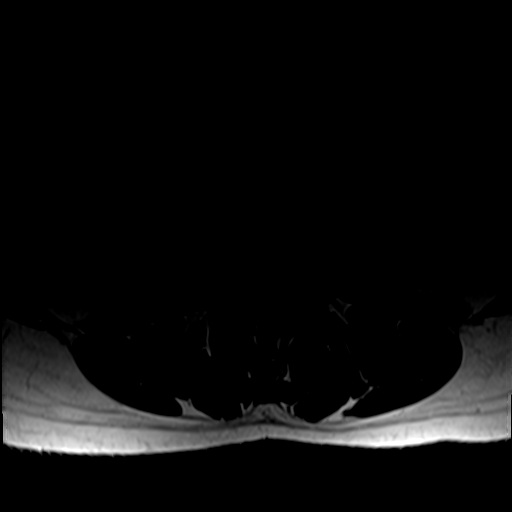
[im 35/41]
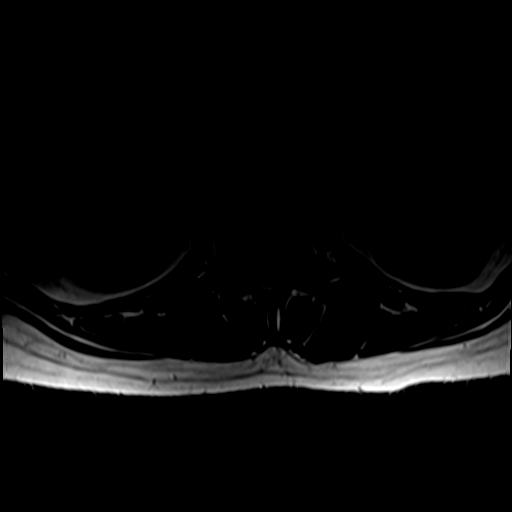

[27 of 48 positions shown; findings below may reference images not displayed]

FINDINGS: Segmentation: For the purposes of this dictation, five lumbar
vertebrae are assumed and the caudal most well-formed intervertebral
disc is designated L5-S1.

Alignment: Straightening of the expected lumbar lordosis. No
significant spondylolisthesis.

Vertebrae: Vertebral body height is maintained. No suspicious
osseous lesions. Mild L5-S1 mixed degenerative endplate marrow
signal.

Conus medullaris and cauda equina: Conus extends to the L1 level.
Conus and cauda equina appear normal.

Paraspinal and other soft tissues: Unremarkable.

Disc levels:

At the L1-L2 through L3-L4 levels: No significant disc herniation.No
significant spinal canal or neural foraminal narrowing.

L4-L5: Mild disc degeneration. Disc bulge. New superimposed small
central disc protrusion at site of posterior annular fissure. Mild
bilateral subarticular narrowing without significant central canal
stenosis. No significant neural foraminal narrowing.

L5-S1: Moderate disc degeneration. Small disc bulge. A superimposed
small right center disc protrusion contacts the descending right S1
nerve root. No significant central canal stenosis or neural
foraminal narrowing. Findings are similar to prior MRI.
IMPRESSION: L4-L5 spondylosis has slightly progressed since prior MRI
03/16/2016. New small central disc protrusion at this level which
causes mild bilateral subarticular narrowing. No significant central
canal stenosis or neural foraminal narrowing.

L5-S1 spondylosis is unchanged. This includes a persistent small
right center disc protrusion contacting the descending right S1
nerve root. No significant central canal or neural foraminal
narrowing at this level.

## 2021-08-28 ENCOUNTER — Other Ambulatory Visit: Payer: Self-pay | Admitting: Rehabilitation

## 2021-08-28 DIAGNOSIS — M5416 Radiculopathy, lumbar region: Secondary | ICD-10-CM

## 2021-09-10 ENCOUNTER — Ambulatory Visit
Admission: RE | Admit: 2021-09-10 | Discharge: 2021-09-10 | Disposition: A | Discharge status: Home / Self Care | Payer: No Typology Code available for payment source | Source: Ambulatory Visit | Attending: Rehabilitation | Admitting: Rehabilitation

## 2021-09-10 DIAGNOSIS — M5416 Radiculopathy, lumbar region: Secondary | ICD-10-CM
